# Patient Record
Sex: Female | Born: 1965 | Race: White | Hispanic: No | Marital: Single
Health system: Midwestern US, Academic
[De-identification: ages and names within clinical notes are randomized; demographics above are authoritative.]

---

## 2017-03-22 LAB — IRON, TOTAL SERUM: Lab: 72 — ABNORMAL HIGH (ref 27.0–31.0)

## 2017-03-22 LAB — LIPID PROFILE: Lab: 142 — ABNORMAL LOW (ref 150–200)

## 2017-03-22 LAB — COMPREHENSIVE METABOLIC PANEL: Lab: 143

## 2017-03-22 LAB — MAGNESIUM: Lab: 2.1

## 2017-03-22 LAB — THYROID STIMULATING HORMONE-TSH: Lab: 0.6 — ABNORMAL HIGH (ref 80.0–99.0)

## 2017-03-22 LAB — FERRITIN: Lab: 153

## 2017-03-22 LAB — PHOSPHORUS: Lab: 3.1

## 2017-05-07 LAB — COMPREHENSIVE METABOLIC PANEL
Lab: 104
Lab: 138
Lab: 3.3 — ABNORMAL LOW (ref 3.5–5.1)

## 2017-05-07 LAB — CBC: Lab: 7

## 2017-05-07 LAB — TROPONIN-I

## 2017-06-12 LAB — BASIC METABOLIC PANEL
Lab: 0.6
Lab: 12
Lab: 141
Lab: 23
Lab: 72

## 2017-06-12 LAB — COPPER FREE SERUM OR PLASMA: Lab: 99 — ABNORMAL HIGH (ref 98–107)

## 2017-06-12 LAB — 25-OH VITAMIN D (D2 + D3): Lab: 10 — ABNORMAL LOW (ref 30.0–100.0)

## 2017-07-23 ENCOUNTER — Encounter: Admit: 2017-07-23 | Discharge: 2017-07-23 | Payer: MEDICARE

## 2017-07-23 DIAGNOSIS — Z8249 Family history of ischemic heart disease and other diseases of the circulatory system: Principal | ICD-10-CM

## 2017-07-31 ENCOUNTER — Encounter: Admit: 2017-07-31 | Discharge: 2017-07-31 | Payer: MEDICARE

## 2017-08-16 ENCOUNTER — Encounter: Admit: 2017-08-16 | Discharge: 2017-08-16 | Payer: MEDICARE

## 2017-08-16 ENCOUNTER — Ambulatory Visit: Admit: 2017-08-16 | Discharge: 2017-08-17 | Payer: MEDICARE

## 2017-08-16 DIAGNOSIS — R001 Bradycardia, unspecified: ICD-10-CM

## 2017-08-16 DIAGNOSIS — R55 Syncope and collapse: Principal | ICD-10-CM

## 2017-08-16 NOTE — Progress Notes
Date of Service: 08/16/2017    Gabriela Hunt is a 51 y.o. female.       HPI     Gabriela Hunt presents today to establish cardiovascular care.  Mild valvular abnormalities were described on a prior echocardiogram.  After undergoing bariatric surgery in 2014, Gabriela Hunt had recurrent syncope for 2 years.  She reports that 2 medications were eliminated from her medical regimen and her syncope resolved and she has had no recurrence of syncope or presyncope over the past 2 years.  This suggests that her syncope may have been due to orthostasis and hypotension.  Currently, she notices only very minimal orthostasis when she stands and this resolves quickly.  Otherwise, the patient has been stable from a cardiovascular perspective and reports no angina, congestive symptoms, palpitations, sensation of sustained forceful heart pounding, lightheadedness or syncope. Exercise tolerance has been stable, although she does not have a regular exercise routine.. The patient reports no myalgias, bleeding abnormalities, neurologic motor abnormalities or difficulty with speech.     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  Today she is accompanied in clinic by a caregiver.  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       Vitals:    08/16/17 0910 08/16/17 0931   BP: 116/86 110/82   Pulse: 75    Weight: 83.2 kg (183 lb 6.4 oz)    Height: 1.727 m (5' 8)      Body mass index is 27.89 kg/m???.     Past Medical History  Patient Active Problem List    Diagnosis Date Noted   ??? Orthostatic hypotension 07/08/2015   ??? Syncope 07/08/2015     02/23/2015 Echo Normal LV size and systolic function EF 60-65 %.  Diastolic relaxation abnormality.  Mild LAE.  Mild aortic regurgitation. Mild mitral regurgitation.  Mild tricuspid regurgitation.     ??? Sinus bradycardia 07/08/2015 ??? Anxiety 07/08/2015   ??? Eczema 07/08/2015   ??? Depressive disorder 07/08/2015   ??? Asthma 07/08/2015   ??? SOB (shortness of breath) on exertion 07/08/2015   ??? Traumatic brain injury [facility]) 07/08/2015     2011: Larey Seat getting out of shower and hit her head-short term memory.     ??? COPD (chronic obstructive pulmonary disease) (HCC) 07/08/2015   ??? Falls 07/08/2015     03/26/15             Review of Systems   Constitution: Positive for malaise/fatigue.   HENT: Negative.    Eyes: Positive for visual halos.   Cardiovascular: Negative.    Respiratory: Negative.    Endocrine: Negative.    Hematologic/Lymphatic: Negative.    Skin: Positive for dry skin.   Musculoskeletal: Positive for arthritis, back pain, falls, joint pain and muscle cramps.   Gastrointestinal: Positive for nausea and vomiting.   Genitourinary: Negative.    Neurological: Negative.    Psychiatric/Behavioral: Positive for depression and memory loss. The patient is nervous/anxious.    Allergic/Immunologic: Negative.        Physical Exam  GENERAL: The patient is well developed, well nourished, resting comfortably and in no distress.   HEENT: No abnormalities of the visible oro-nasopharynx, conjunctiva or sclera are noted.  NECK: There is no jugular venous distension. Carotids are palpable and without bruits. There is no thyroid enlargement.  Chest: Lung fields are  clear to auscultation. There are no wheezes or crackles.  CV: There is a regular rhythm. The first and second heart sounds are normal. There are no murmurs, gallops or rubs.  ABD: The abdomen is soft and supple with normal bowel sounds. There is no hepatosplenomegaly, ascites, tenderness, masses or bruits.  Neuro: There are no focal motor defects. Ambulation is normal. Cognitive function appears normal.  Ext: There is no edema or evidence of deep vein thrombosis. Peripheral pulses are satisfactory.    SKIN: There are no rashes and no cellulitis  PSYCH: The patient is calm, rationale and oriented. Cardiovascular Studies  A twelve-lead ECG was obtained on 08/16/2017 reveals normal sinus rhythm with a heart rate of 75 bpm.  Left axis deviation is noted.  There is no evidence of myocardial ischemia or infarction.  Her QT interval is 400 ms yielding a QT corrected of 4 4 7  seconds.  Labs from 05/07/2017 revealed hemoglobin of 14.2 g/dL, hematocrit 16.1%, white count 7.0 and platelet count 171K.  Labs from 03/22/2017 revealed serum creatinine 0.64 mg/dL, ALT 39, TSH 0.96 (normal range 0.35-4.94 ???U/L), total cholesterol 142, triglycerides 94, HDL 43, and LDL 78 mg/dL her magnesium was 2.1 mg/dL  Her lipid profile and clinical profile give her a 0.9% risk of developing ASCVD in 10 years.  An outside echocardiogram obtained on 02/23/2015 revealed 1) normal left ventricular size and systolic function with a left ventricular ejection fraction of 60-65%.  2) diastolic relaxation abnormality.  3) mild left atrial enlargement.  4) mild aortic valve regurgitation.  5) mild mitral valve regurgitation, and 6) mild tricuspid valve regurgitation.    Problems Addressed Today  Orthostatic hypotension  Assessment and Plan     Gabriela Hunt used to have recurrent syncope which was probably due to orthostatic hypotension.  She currently has minimal orthostasis and reports no angina or congestive symptoms, palpitations, lightheadedness presyncope or syncope.  She has not had an episode of syncope in approximately 2 years she appears to be tolerating her current medical regimen.difficulty. Regular mild aerobic exercise and adherence to a heart healthy diet were recommended.  I have asked her to return for follow-up in approximately 1 years time.           Current Medications (including today's revisions)  ??? albuterol (PROAIR HFA, VENTOLIN HFA, OR PROVENTIL HFA) 90 mcg/actuation inhaler Inhale 1-2 puffs by mouth into the lungs every 4-6 hours as needed for Wheezing or Shortness of Breath. Shake well before use. ??? albuterol 0.5% (PROVENTIL; VENTOLIN) 2.5 mg/0.5 mL nebulizer solution Inhale 2.5 mg solution by nebulizer as directed every 4 hours as needed for Shortness of Breath or Wheezing.   ??? busPIRone (BUSPAR) 10 mg tablet Take 10 mg by mouth three times daily.   ??? cholecalciferol(+) (Vitamin D3) 50,000 units capsule Take 50,000 Units by mouth every 7 days.   ??? Copper Gluconate 2 mg tab Take 2 mg by mouth daily.   ??? cyanocobalamin (VITAMIN B-12, RUBRAMIN) 1,000 mcg/mL injection Inject 1 mL into the muscle every 30 days.   ??? divalproex ER (DEPAKOTE ER) 250 mg ER tablet Take 250 mg by mouth at bedtime daily. Take with food.   ??? fluconazole (DIFLUCAN) 150 mg tablet Take 150 mg by mouth daily.   ??? fludrocortisone (FLORINEF) 0.1 mg tablet Take 0.1 mg by mouth daily.   ??? fluticasone/salmeterol (ADVAIR DISKUS) 250/50 mcg inhalation disk Inhale 1 puff by mouth into the lungs as Needed.   ??? gabapentin (NEURONTIN) 600 mg tablet  Take 600 mg by mouth three times daily.   ??? HYDROcodone/acetaminophen(+) (NORCO) 10/325 mg tablet Take 1 tablet by mouth four times daily as needed for Pain    ??? loperamide (IMODIUM) 2 mg capsule Take 2 mg by mouth as Needed for Diarrhea.   ??? LORazepam (ATIVAN) 1 mg tablet Take 1 mg by mouth three times daily as needed for Nausea, Vomiting or Other....   ??? nystatin (MYCOSTATIN) 100,000 unit/g topical ointment Apply  topically to affected area as Needed.   ??? omeprazole DR(+) (PRILOSEC) 20 mg capsule Take 20 mg by mouth daily before breakfast.   ??? potassium chloride SR (K-DUR) 20 mEq tablet Take 20 mEq by mouth daily. Take with a meal and a full glass of water.    ??? promethazine (PHENERGAN) 25 mg tablet Take 25 mg by mouth as Needed.   ??? sertraline (ZOLOFT) 25 mg tablet Take 25 mg by mouth daily.   ??? SUMAtriptan succinate (IMITREX) 50 mg tablet Take 50 mg by mouth twice daily as needed for Migraine symptoms. Dose may be repeated in 2 hours if needed. Max of 2 tablets in 24 hours. ??? Syringe (Disposable) 5 mL syrg Use  as directed.   ??? traZODone (DESYREL) 100 mg tablet Take 100 mg by mouth at bedtime daily.   ??? zaleplon(+) (SONATA) 10 mg capsule Take 10 mg by mouth at bedtime daily.   ??? zolpidem CR(+) (AMBIEN CR) 12.5 mg tablet Take 12.5 mg by mouth at bedtime as needed for Sleep.

## 2017-12-14 ENCOUNTER — Encounter: Admit: 2017-12-14 | Discharge: 2017-12-14 | Payer: MEDICARE

## 2018-01-03 ENCOUNTER — Ambulatory Visit: Admit: 2018-01-03 | Discharge: 2018-01-04 | Payer: MEDICARE

## 2018-01-03 ENCOUNTER — Encounter: Admit: 2018-01-03 | Discharge: 2018-01-03 | Payer: MEDICARE

## 2018-01-03 DIAGNOSIS — F329 Major depressive disorder, single episode, unspecified: ICD-10-CM

## 2018-01-03 DIAGNOSIS — G43909 Migraine, unspecified, not intractable, without status migrainosus: ICD-10-CM

## 2018-01-03 DIAGNOSIS — S99922S Unspecified injury of left foot, sequela: ICD-10-CM

## 2018-01-03 DIAGNOSIS — S069X9A Unspecified intracranial injury with loss of consciousness of unspecified duration, initial encounter: ICD-10-CM

## 2018-01-03 DIAGNOSIS — G4700 Insomnia, unspecified: ICD-10-CM

## 2018-01-03 DIAGNOSIS — R1110 Vomiting, unspecified: ICD-10-CM

## 2018-01-03 DIAGNOSIS — G8929 Other chronic pain: ICD-10-CM

## 2018-01-03 DIAGNOSIS — Z9884 Bariatric surgery status: ICD-10-CM

## 2018-01-03 DIAGNOSIS — I951 Orthostatic hypotension: Principal | ICD-10-CM

## 2018-01-03 DIAGNOSIS — Z01810 Encounter for preprocedural cardiovascular examination: ICD-10-CM

## 2018-01-03 DIAGNOSIS — F419 Anxiety disorder, unspecified: ICD-10-CM

## 2018-01-03 DIAGNOSIS — M5136 Other intervertebral disc degeneration, lumbar region: ICD-10-CM

## 2018-01-03 DIAGNOSIS — J449 Chronic obstructive pulmonary disease, unspecified: ICD-10-CM

## 2018-01-09 LAB — COMPREHENSIVE METABOLIC PANEL
Lab: 0.4
Lab: 0.6
Lab: 106
Lab: 110
Lab: 12
Lab: 12 — ABNORMAL LOW (ref 33.0–37.0)
Lab: 139 — ABNORMAL LOW (ref 12.0–16.0)
Lab: 25
Lab: 28
Lab: 3.5
Lab: 34
Lab: 4.2 — ABNORMAL LOW (ref 37.0–47.0)
Lab: 6.9
Lab: 71 — ABNORMAL LOW (ref 8.4–10.2)
Lab: 9.2
Lab: 95

## 2018-01-15 ENCOUNTER — Encounter: Admit: 2018-01-15 | Discharge: 2018-01-15 | Payer: MEDICARE

## 2018-01-15 DIAGNOSIS — M25561 Pain in right knee: Principal | ICD-10-CM

## 2018-06-09 LAB — CBC: Lab: 7.4

## 2018-06-09 LAB — BASIC METABOLIC PANEL: Lab: 136 — ABNORMAL LOW (ref 4.20–5.40)

## 2018-07-11 ENCOUNTER — Ambulatory Visit: Admit: 2018-07-11 | Discharge: 2018-07-12 | Payer: MEDICARE

## 2018-07-11 ENCOUNTER — Encounter: Admit: 2018-07-11 | Discharge: 2018-07-11 | Payer: MEDICARE

## 2018-07-11 DIAGNOSIS — R001 Bradycardia, unspecified: ICD-10-CM

## 2018-07-11 DIAGNOSIS — S069X9A Unspecified intracranial injury with loss of consciousness of unspecified duration, initial encounter: ICD-10-CM

## 2018-07-11 DIAGNOSIS — F329 Major depressive disorder, single episode, unspecified: ICD-10-CM

## 2018-07-11 DIAGNOSIS — I951 Orthostatic hypotension: ICD-10-CM

## 2018-07-11 DIAGNOSIS — S99922S Unspecified injury of left foot, sequela: ICD-10-CM

## 2018-07-11 DIAGNOSIS — E271 Primary adrenocortical insufficiency: ICD-10-CM

## 2018-07-11 DIAGNOSIS — Z9884 Bariatric surgery status: ICD-10-CM

## 2018-07-11 DIAGNOSIS — F419 Anxiety disorder, unspecified: ICD-10-CM

## 2018-07-11 DIAGNOSIS — J45909 Unspecified asthma, uncomplicated: ICD-10-CM

## 2018-07-11 DIAGNOSIS — M5136 Other intervertebral disc degeneration, lumbar region: ICD-10-CM

## 2018-07-11 DIAGNOSIS — J449 Chronic obstructive pulmonary disease, unspecified: ICD-10-CM

## 2018-07-11 DIAGNOSIS — G4700 Insomnia, unspecified: ICD-10-CM

## 2018-07-11 DIAGNOSIS — R0602 Shortness of breath: ICD-10-CM

## 2018-07-11 DIAGNOSIS — R55 Syncope and collapse: ICD-10-CM

## 2018-07-11 DIAGNOSIS — R079 Chest pain, unspecified: ICD-10-CM

## 2018-07-11 DIAGNOSIS — R1110 Vomiting, unspecified: ICD-10-CM

## 2018-07-11 DIAGNOSIS — R072 Precordial pain: ICD-10-CM

## 2018-07-11 DIAGNOSIS — G43909 Migraine, unspecified, not intractable, without status migrainosus: ICD-10-CM

## 2018-07-11 DIAGNOSIS — W19XXXA Unspecified fall, initial encounter: ICD-10-CM

## 2018-07-11 DIAGNOSIS — R600 Localized edema: ICD-10-CM

## 2018-07-11 DIAGNOSIS — G8929 Other chronic pain: ICD-10-CM

## 2018-07-12 ENCOUNTER — Encounter: Admit: 2018-07-12 | Discharge: 2018-07-12 | Payer: MEDICARE

## 2018-07-15 ENCOUNTER — Encounter: Admit: 2018-07-15 | Discharge: 2018-07-15 | Payer: MEDICARE

## 2018-07-22 ENCOUNTER — Encounter: Admit: 2018-07-22 | Discharge: 2018-07-22 | Payer: MEDICARE

## 2018-07-26 ENCOUNTER — Encounter: Admit: 2018-07-26 | Discharge: 2018-07-26 | Payer: MEDICARE

## 2018-08-08 ENCOUNTER — Encounter: Admit: 2018-08-08 | Discharge: 2018-08-08 | Payer: MEDICARE

## 2018-08-08 ENCOUNTER — Ambulatory Visit: Admit: 2018-08-08 | Discharge: 2018-08-08 | Payer: MEDICARE

## 2018-08-08 DIAGNOSIS — R0602 Shortness of breath: Principal | ICD-10-CM

## 2018-08-08 DIAGNOSIS — R079 Chest pain, unspecified: ICD-10-CM

## 2018-08-19 ENCOUNTER — Encounter: Admit: 2018-08-19 | Discharge: 2018-08-19 | Payer: MEDICARE

## 2018-08-19 DIAGNOSIS — R55 Syncope and collapse: ICD-10-CM

## 2018-08-19 DIAGNOSIS — R9430 Abnormal result of cardiovascular function study, unspecified: Principal | ICD-10-CM

## 2018-08-19 DIAGNOSIS — R0602 Shortness of breath: ICD-10-CM

## 2018-08-19 DIAGNOSIS — Z9884 Bariatric surgery status: ICD-10-CM

## 2018-09-06 ENCOUNTER — Ambulatory Visit: Admit: 2018-09-06 | Discharge: 2018-09-06 | Payer: MEDICARE

## 2018-09-06 DIAGNOSIS — R0602 Shortness of breath: ICD-10-CM

## 2018-09-06 DIAGNOSIS — R9430 Abnormal result of cardiovascular function study, unspecified: Principal | ICD-10-CM

## 2018-09-06 DIAGNOSIS — Z9884 Bariatric surgery status: ICD-10-CM

## 2018-09-06 DIAGNOSIS — R55 Syncope and collapse: ICD-10-CM

## 2018-09-06 MED ORDER — SODIUM CHLORIDE 0.9 % IJ SOLN
50 mL | Freq: Once | INTRAVENOUS | 0 refills | Status: CP
Start: 2018-09-06 — End: ?
  Administered 2018-09-06: 16:00:00 50 mL via INTRAVENOUS

## 2018-09-06 MED ORDER — GADOBENATE DIMEGLUMINE 529 MG/ML (0.1MMOL/0.2ML) IV SOLN
40 mL | Freq: Once | INTRAVENOUS | 0 refills | Status: CP
Start: 2018-09-06 — End: ?
  Administered 2018-09-06: 16:00:00 40 mL via INTRAVENOUS

## 2018-09-10 ENCOUNTER — Encounter: Admit: 2018-09-10 | Discharge: 2018-09-10 | Payer: MEDICARE

## 2018-10-01 ENCOUNTER — Ambulatory Visit: Admit: 2018-10-01 | Discharge: 2018-10-01 | Payer: MEDICARE

## 2018-10-01 ENCOUNTER — Encounter: Admit: 2018-10-01 | Discharge: 2018-10-01 | Payer: MEDICARE

## 2018-10-01 DIAGNOSIS — F329 Major depressive disorder, single episode, unspecified: ICD-10-CM

## 2018-10-01 DIAGNOSIS — S069X9A Unspecified intracranial injury with loss of consciousness of unspecified duration, initial encounter: ICD-10-CM

## 2018-10-01 DIAGNOSIS — Z9884 Bariatric surgery status: ICD-10-CM

## 2018-10-01 DIAGNOSIS — G4700 Insomnia, unspecified: ICD-10-CM

## 2018-10-01 DIAGNOSIS — J449 Chronic obstructive pulmonary disease, unspecified: ICD-10-CM

## 2018-10-01 DIAGNOSIS — R001 Bradycardia, unspecified: ICD-10-CM

## 2018-10-01 DIAGNOSIS — S99922S Unspecified injury of left foot, sequela: ICD-10-CM

## 2018-10-01 DIAGNOSIS — R931 Abnormal findings on diagnostic imaging of heart and coronary circulation: ICD-10-CM

## 2018-10-01 DIAGNOSIS — G8929 Other chronic pain: ICD-10-CM

## 2018-10-01 DIAGNOSIS — R1110 Vomiting, unspecified: ICD-10-CM

## 2018-10-01 DIAGNOSIS — M5136 Other intervertebral disc degeneration, lumbar region: ICD-10-CM

## 2018-10-01 DIAGNOSIS — F419 Anxiety disorder, unspecified: ICD-10-CM

## 2018-10-01 DIAGNOSIS — W19XXXA Unspecified fall, initial encounter: ICD-10-CM

## 2018-10-01 DIAGNOSIS — E271 Primary adrenocortical insufficiency: ICD-10-CM

## 2018-10-01 DIAGNOSIS — I951 Orthostatic hypotension: Principal | ICD-10-CM

## 2018-10-01 DIAGNOSIS — G43909 Migraine, unspecified, not intractable, without status migrainosus: ICD-10-CM

## 2018-10-01 IMAGING — CR LOW_EXM
2 series · 2 of 2 positions shown · non-contrast
Comparison: none

[knee ap]
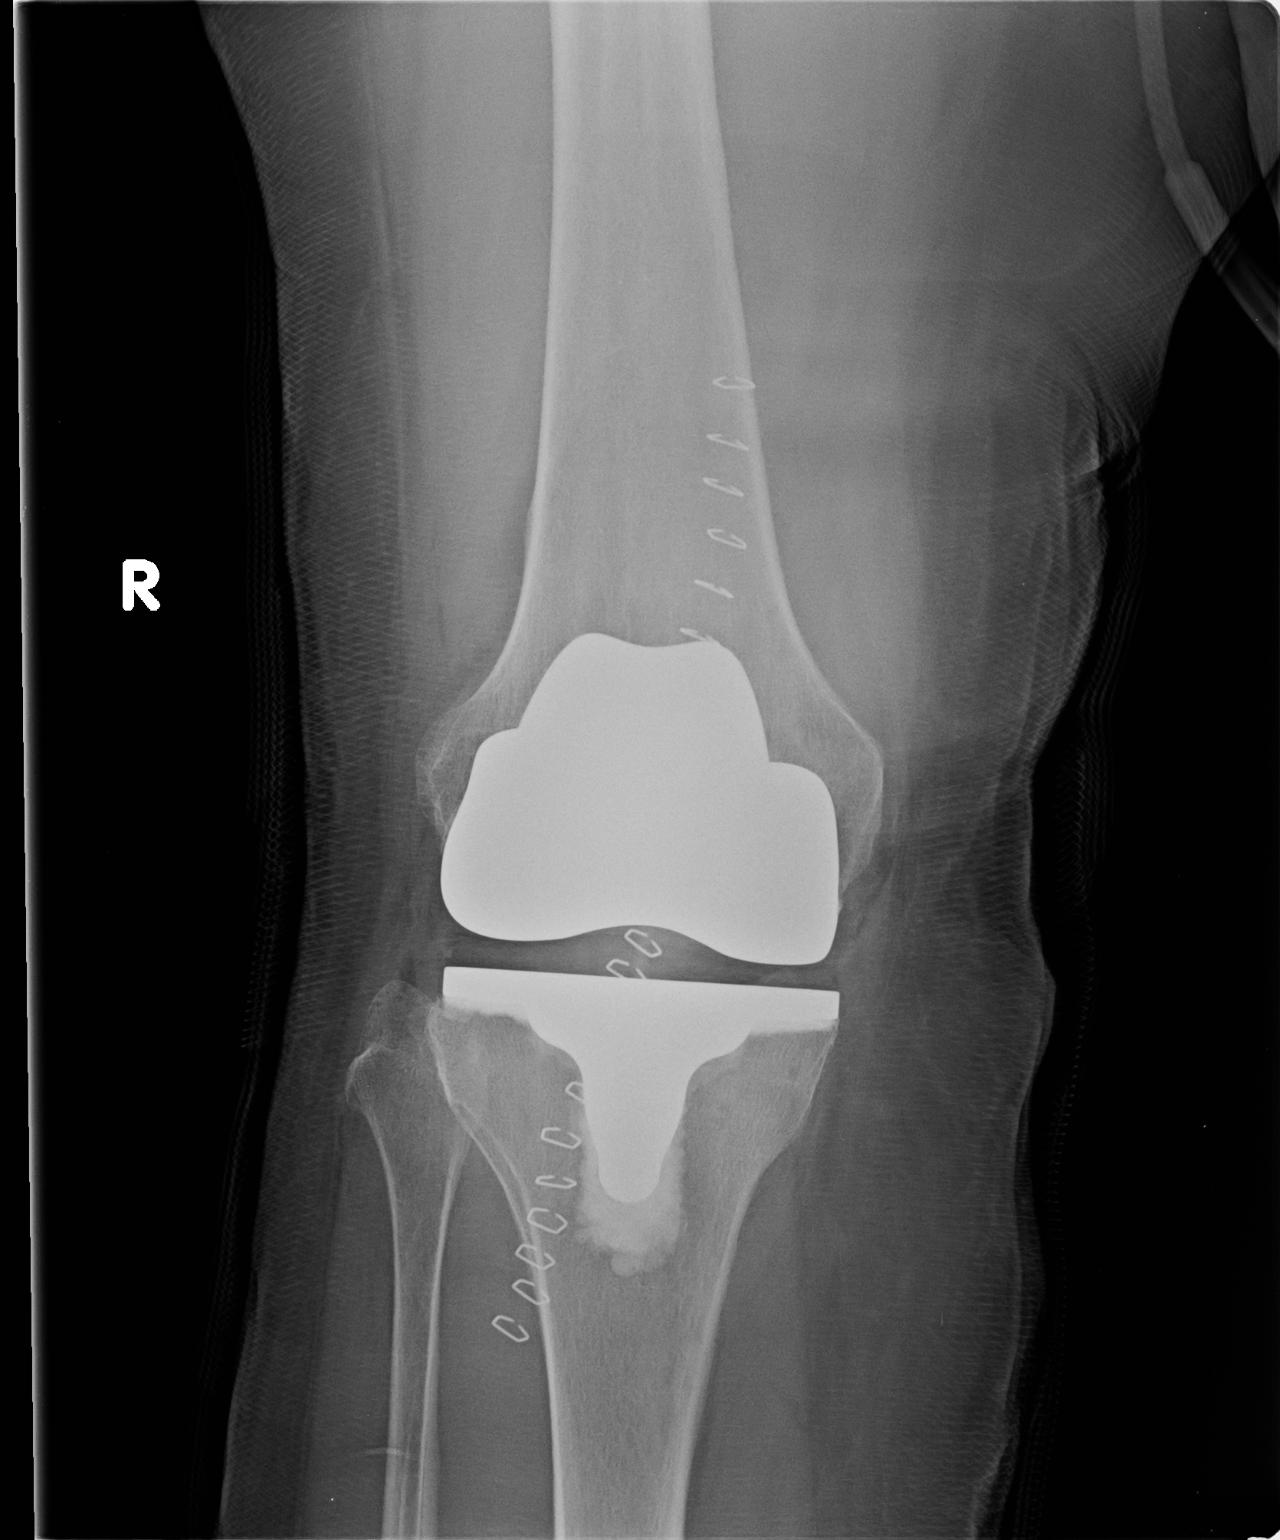

[knee lat]
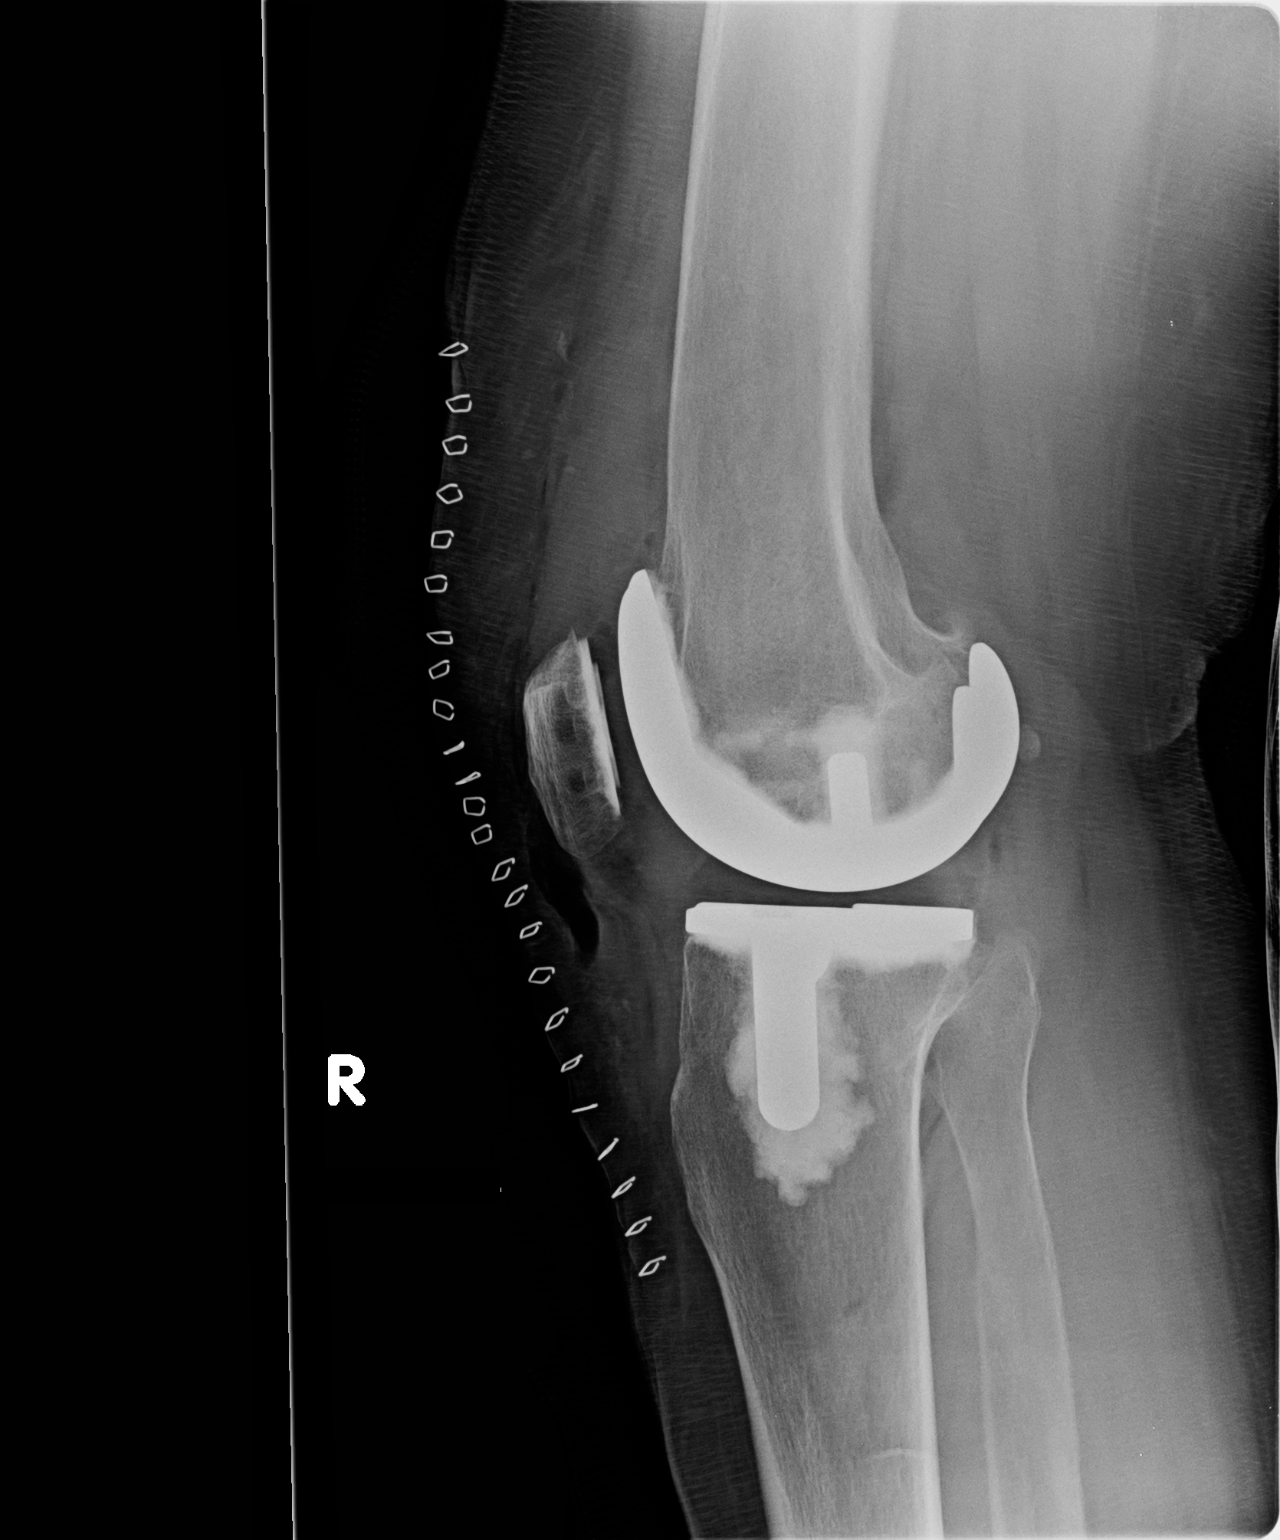

[2 of 2 positions shown; findings below may reference images not displayed]

DIAGNOSTIC STUDIES

EXAM

RADIOLOGICAL EXAMINATION, KNEE RIGHT; 1 OR 2 VIEWS CPT 01917

INDICATION

Postop total knee arthroplasty.

COMPARISONS

No priors available for comparison.

FINDINGS

Status post total knee arthroplasty. The hardware appears intact. There are staples overlying the
knee.

IMPRESSION

Status post total knee arthroplasty.

Tech Notes:

POST OP RIGHT TKA. TJ/CK

## 2018-10-02 ENCOUNTER — Encounter: Admit: 2018-10-02 | Discharge: 2018-10-02 | Payer: MEDICARE

## 2018-10-02 ENCOUNTER — Ambulatory Visit: Admit: 2018-10-02 | Discharge: 2018-10-03 | Payer: MEDICARE

## 2018-10-02 DIAGNOSIS — R1110 Vomiting, unspecified: ICD-10-CM

## 2018-10-02 DIAGNOSIS — F329 Major depressive disorder, single episode, unspecified: ICD-10-CM

## 2018-10-02 DIAGNOSIS — I951 Orthostatic hypotension: Principal | ICD-10-CM

## 2018-10-02 DIAGNOSIS — G43909 Migraine, unspecified, not intractable, without status migrainosus: ICD-10-CM

## 2018-10-02 DIAGNOSIS — S99922S Unspecified injury of left foot, sequela: ICD-10-CM

## 2018-10-02 DIAGNOSIS — Z9884 Bariatric surgery status: ICD-10-CM

## 2018-10-02 DIAGNOSIS — M5136 Other intervertebral disc degeneration, lumbar region: ICD-10-CM

## 2018-10-02 DIAGNOSIS — J449 Chronic obstructive pulmonary disease, unspecified: ICD-10-CM

## 2018-10-02 DIAGNOSIS — G8929 Other chronic pain: ICD-10-CM

## 2018-10-02 DIAGNOSIS — S069X9A Unspecified intracranial injury with loss of consciousness of unspecified duration, initial encounter: ICD-10-CM

## 2018-10-02 DIAGNOSIS — F419 Anxiety disorder, unspecified: ICD-10-CM

## 2018-10-02 DIAGNOSIS — G4700 Insomnia, unspecified: ICD-10-CM

## 2018-10-03 ENCOUNTER — Encounter: Admit: 2018-11-15 | Discharge: 2018-11-16 | Payer: MEDICARE

## 2018-10-03 ENCOUNTER — Encounter: Admit: 2018-10-03 | Discharge: 2018-10-03 | Payer: MEDICARE

## 2018-10-03 DIAGNOSIS — E65 Localized adiposity: Principal | ICD-10-CM

## 2018-10-03 DIAGNOSIS — M793 Panniculitis, unspecified: Principal | ICD-10-CM

## 2018-10-08 ENCOUNTER — Encounter: Admit: 2018-10-08 | Discharge: 2018-10-08 | Payer: MEDICARE

## 2018-10-08 DIAGNOSIS — J449 Chronic obstructive pulmonary disease, unspecified: Principal | ICD-10-CM

## 2018-11-13 ENCOUNTER — Encounter: Admit: 2018-11-13 | Discharge: 2018-11-13 | Payer: MEDICARE

## 2018-11-13 ENCOUNTER — Ambulatory Visit: Admit: 2018-11-13 | Discharge: 2018-11-14 | Payer: MEDICARE

## 2018-11-13 DIAGNOSIS — G4700 Insomnia, unspecified: Secondary | ICD-10-CM

## 2018-11-13 DIAGNOSIS — S069X9A Unspecified intracranial injury with loss of consciousness of unspecified duration, initial encounter: Secondary | ICD-10-CM

## 2018-11-13 DIAGNOSIS — F419 Anxiety disorder, unspecified: Secondary | ICD-10-CM

## 2018-11-13 DIAGNOSIS — M1990 Unspecified osteoarthritis, unspecified site: Secondary | ICD-10-CM

## 2018-11-13 DIAGNOSIS — S99922S Unspecified injury of left foot, sequela: Secondary | ICD-10-CM

## 2018-11-13 DIAGNOSIS — E610 Copper deficiency: Secondary | ICD-10-CM

## 2018-11-13 DIAGNOSIS — J42 Unspecified chronic bronchitis: Secondary | ICD-10-CM

## 2018-11-13 DIAGNOSIS — G43909 Migraine, unspecified, not intractable, without status migrainosus: Secondary | ICD-10-CM

## 2018-11-13 DIAGNOSIS — S329XXA Fracture of unspecified parts of lumbosacral spine and pelvis, initial encounter for closed fracture: Secondary | ICD-10-CM

## 2018-11-13 DIAGNOSIS — G629 Polyneuropathy, unspecified: Secondary | ICD-10-CM

## 2018-11-13 DIAGNOSIS — E271 Primary adrenocortical insufficiency: Secondary | ICD-10-CM

## 2018-11-13 DIAGNOSIS — R1110 Vomiting, unspecified: Secondary | ICD-10-CM

## 2018-11-13 DIAGNOSIS — I951 Orthostatic hypotension: Secondary | ICD-10-CM

## 2018-11-13 DIAGNOSIS — M5136 Other intervertebral disc degeneration, lumbar region: Secondary | ICD-10-CM

## 2018-11-13 DIAGNOSIS — K219 Gastro-esophageal reflux disease without esophagitis: Secondary | ICD-10-CM

## 2018-11-13 DIAGNOSIS — G8929 Other chronic pain: Secondary | ICD-10-CM

## 2018-11-13 DIAGNOSIS — J449 Chronic obstructive pulmonary disease, unspecified: Secondary | ICD-10-CM

## 2018-11-13 DIAGNOSIS — Z9884 Bariatric surgery status: Secondary | ICD-10-CM

## 2018-11-13 DIAGNOSIS — J45909 Unspecified asthma, uncomplicated: Secondary | ICD-10-CM

## 2018-11-13 DIAGNOSIS — N200 Calculus of kidney: Secondary | ICD-10-CM

## 2018-11-13 DIAGNOSIS — F329 Major depressive disorder, single episode, unspecified: Secondary | ICD-10-CM

## 2018-11-13 LAB — CBC
Lab: 13 g/dL (ref 12.0–15.0)
Lab: 17 % — ABNORMAL HIGH (ref 11–15)
Lab: 4.3 M/UL (ref 4.0–5.0)
Lab: 8.9 10*3/uL (ref 4.5–11.0)

## 2018-11-13 LAB — COMPREHENSIVE METABOLIC PANEL
Lab: 0.3 mg/dL (ref 0.3–1.2)
Lab: 0.7 mg/dL (ref 0.4–1.00)
Lab: 102 MMOL/L (ref 98–110)
Lab: 119 U/L — ABNORMAL HIGH (ref 25–110)
Lab: 134 MMOL/L — ABNORMAL LOW (ref 137–147)
Lab: 24 MMOL/L (ref 21–30)
Lab: 3.9 g/dL (ref 3.5–5.0)
Lab: 33 U/L (ref 7–40)
Lab: 4 MMOL/L (ref 3.5–5.1)
Lab: 45 U/L (ref 7–56)
Lab: 6.8 g/dL (ref 6.0–8.0)
Lab: 77 mg/dL (ref 70–100)
Lab: 8.6 mg/dL (ref 8.5–10.6)
Lab: >60 mL/min (ref >60)
Lab: >60 mL/min (ref >60)
Lab: 8 (ref 3–12)

## 2018-11-14 ENCOUNTER — Encounter: Admit: 2018-11-14 | Discharge: 2018-11-14 | Payer: MEDICARE

## 2018-11-14 DIAGNOSIS — E65 Localized adiposity: Secondary | ICD-10-CM

## 2018-11-14 DIAGNOSIS — J42 Unspecified chronic bronchitis: Secondary | ICD-10-CM

## 2018-11-14 DIAGNOSIS — N200 Calculus of kidney: Secondary | ICD-10-CM

## 2018-11-14 DIAGNOSIS — E271 Primary adrenocortical insufficiency: Secondary | ICD-10-CM

## 2018-11-14 DIAGNOSIS — F419 Anxiety disorder, unspecified: Secondary | ICD-10-CM

## 2018-11-14 DIAGNOSIS — R1110 Vomiting, unspecified: Secondary | ICD-10-CM

## 2018-11-14 DIAGNOSIS — E610 Copper deficiency: Secondary | ICD-10-CM

## 2018-11-14 DIAGNOSIS — F329 Major depressive disorder, single episode, unspecified: Secondary | ICD-10-CM

## 2018-11-14 DIAGNOSIS — M1990 Unspecified osteoarthritis, unspecified site: Secondary | ICD-10-CM

## 2018-11-14 DIAGNOSIS — G4700 Insomnia, unspecified: Secondary | ICD-10-CM

## 2018-11-14 DIAGNOSIS — S329XXA Fracture of unspecified parts of lumbosacral spine and pelvis, initial encounter for closed fracture: Secondary | ICD-10-CM

## 2018-11-14 DIAGNOSIS — G8929 Other chronic pain: Secondary | ICD-10-CM

## 2018-11-14 DIAGNOSIS — K219 Gastro-esophageal reflux disease without esophagitis: Secondary | ICD-10-CM

## 2018-11-14 DIAGNOSIS — S99922S Unspecified injury of left foot, sequela: Secondary | ICD-10-CM

## 2018-11-14 DIAGNOSIS — J449 Chronic obstructive pulmonary disease, unspecified: Secondary | ICD-10-CM

## 2018-11-14 DIAGNOSIS — I951 Orthostatic hypotension: Secondary | ICD-10-CM

## 2018-11-14 DIAGNOSIS — Z9884 Bariatric surgery status: Secondary | ICD-10-CM

## 2018-11-14 DIAGNOSIS — G629 Polyneuropathy, unspecified: Secondary | ICD-10-CM

## 2018-11-14 DIAGNOSIS — S069X9A Unspecified intracranial injury with loss of consciousness of unspecified duration, initial encounter: Secondary | ICD-10-CM

## 2018-11-14 DIAGNOSIS — M5136 Other intervertebral disc degeneration, lumbar region: Secondary | ICD-10-CM

## 2018-11-14 DIAGNOSIS — J45909 Unspecified asthma, uncomplicated: Secondary | ICD-10-CM

## 2018-11-14 DIAGNOSIS — Z01810 Encounter for preprocedural cardiovascular examination: Secondary | ICD-10-CM

## 2018-11-14 DIAGNOSIS — G43909 Migraine, unspecified, not intractable, without status migrainosus: Secondary | ICD-10-CM

## 2018-11-15 ENCOUNTER — Encounter: Admit: 2018-11-15 | Discharge: 2018-11-15 | Payer: MEDICARE

## 2018-11-15 DIAGNOSIS — G43909 Migraine, unspecified, not intractable, without status migrainosus: Secondary | ICD-10-CM

## 2018-11-15 DIAGNOSIS — M5136 Other intervertebral disc degeneration, lumbar region: Secondary | ICD-10-CM

## 2018-11-15 DIAGNOSIS — I951 Orthostatic hypotension: Secondary | ICD-10-CM

## 2018-11-15 DIAGNOSIS — G629 Polyneuropathy, unspecified: Secondary | ICD-10-CM

## 2018-11-15 DIAGNOSIS — G4700 Insomnia, unspecified: Secondary | ICD-10-CM

## 2018-11-15 DIAGNOSIS — K219 Gastro-esophageal reflux disease without esophagitis: Secondary | ICD-10-CM

## 2018-11-15 DIAGNOSIS — S329XXA Fracture of unspecified parts of lumbosacral spine and pelvis, initial encounter for closed fracture: Secondary | ICD-10-CM

## 2018-11-15 DIAGNOSIS — R1110 Vomiting, unspecified: Secondary | ICD-10-CM

## 2018-11-15 DIAGNOSIS — J42 Unspecified chronic bronchitis: Secondary | ICD-10-CM

## 2018-11-15 DIAGNOSIS — J449 Chronic obstructive pulmonary disease, unspecified: Secondary | ICD-10-CM

## 2018-11-15 DIAGNOSIS — J45909 Unspecified asthma, uncomplicated: Secondary | ICD-10-CM

## 2018-11-15 DIAGNOSIS — F419 Anxiety disorder, unspecified: Secondary | ICD-10-CM

## 2018-11-15 DIAGNOSIS — Z9884 Bariatric surgery status: Secondary | ICD-10-CM

## 2018-11-15 DIAGNOSIS — N200 Calculus of kidney: Secondary | ICD-10-CM

## 2018-11-15 DIAGNOSIS — E610 Copper deficiency: Secondary | ICD-10-CM

## 2018-11-15 DIAGNOSIS — S069X9A Unspecified intracranial injury with loss of consciousness of unspecified duration, initial encounter: Secondary | ICD-10-CM

## 2018-11-15 DIAGNOSIS — S99922S Unspecified injury of left foot, sequela: Secondary | ICD-10-CM

## 2018-11-15 DIAGNOSIS — G8929 Other chronic pain: Secondary | ICD-10-CM

## 2018-11-15 DIAGNOSIS — F329 Major depressive disorder, single episode, unspecified: Secondary | ICD-10-CM

## 2018-11-15 DIAGNOSIS — E271 Primary adrenocortical insufficiency: Secondary | ICD-10-CM

## 2018-11-15 DIAGNOSIS — M1990 Unspecified osteoarthritis, unspecified site: Secondary | ICD-10-CM

## 2018-11-15 MED ORDER — HYDROMORPHONE (PF) 2 MG/ML IJ SYRG
.5-1 mg | INTRAVENOUS | 0 refills | Status: DC | PRN
Start: 2018-11-15 — End: 2018-11-16
  Administered 2018-11-15 (×3): 0.5 mg via INTRAVENOUS

## 2018-11-15 MED ORDER — SUGAMMADEX 100 MG/ML IV SOLN
INTRAVENOUS | 0 refills | Status: DC
Start: 2018-11-15 — End: 2018-11-15
  Administered 2018-11-15: 18:00:00 200 mg via INTRAVENOUS

## 2018-11-15 MED ORDER — MEPERIDINE (PF) 25 MG/ML IJ SYRG
12.5 mg | INTRAVENOUS | 0 refills | Status: DC | PRN
Start: 2018-11-15 — End: 2018-11-16

## 2018-11-15 MED ORDER — LIDOCAINE (PF) 10 MG/ML (1 %) IJ SOLN
.1-2 mL | INTRAMUSCULAR | 0 refills | Status: DC | PRN
Start: 2018-11-15 — End: 2018-11-16

## 2018-11-15 MED ORDER — ALBUTEROL SULFATE 2.5 MG /3 ML (0.083 %) IN NEBU
2.5 mg | RESPIRATORY_TRACT | 0 refills | Status: DC | PRN
Start: 2018-11-15 — End: 2018-11-16
  Administered 2018-11-16: 12:00:00 2.5 mg via RESPIRATORY_TRACT

## 2018-11-15 MED ORDER — LIDOCAINE (PF) 200 MG/10 ML (2 %) IJ SYRG
0 refills | Status: DC
Start: 2018-11-15 — End: 2018-11-15
  Administered 2018-11-15: 17:00:00 100 mg via INTRAVENOUS

## 2018-11-15 MED ORDER — DIPHENHYDRAMINE HCL 50 MG/ML IJ SOLN
25 mg | Freq: Once | INTRAVENOUS | 0 refills | Status: DC | PRN
Start: 2018-11-15 — End: 2018-11-16

## 2018-11-15 MED ORDER — TOPIRAMATE 25 MG PO TAB
25 mg | Freq: Two times a day (BID) | ORAL | 0 refills | Status: DC
Start: 2018-11-15 — End: 2018-11-16
  Administered 2018-11-16 (×2): 25 mg via ORAL

## 2018-11-15 MED ORDER — FLUDROCORTISONE 0.1 MG PO TAB
0.1 mg | Freq: Every day | ORAL | 0 refills | Status: DC
Start: 2018-11-15 — End: 2018-11-16
  Administered 2018-11-16 (×2): 0.1 mg via ORAL

## 2018-11-15 MED ORDER — ALBUTEROL SULFATE 2.5 MG/0.5 ML IN NEBU
2.5 mg | RESPIRATORY_TRACT | 0 refills | Status: DC | PRN
Start: 2018-11-15 — End: 2018-11-16

## 2018-11-15 MED ORDER — LACTATED RINGERS IV SOLP
INTRAVENOUS | 0 refills | Status: DC
Start: 2018-11-15 — End: 2018-11-16
  Administered 2018-11-15 (×2): 1000.000 mL via INTRAVENOUS

## 2018-11-15 MED ORDER — ACETAMINOPHEN 500 MG PO TAB
1000 mg | Freq: Once | ORAL | 0 refills | Status: CP
Start: 2018-11-15 — End: ?
  Administered 2018-11-15: 16:00:00 1000 mg via ORAL

## 2018-11-15 MED ORDER — BACLOFEN 10 MG PO TAB
10 mg | Freq: Four times a day (QID) | ORAL | 0 refills | Status: DC | PRN
Start: 2018-11-15 — End: 2018-11-16
  Administered 2018-11-16: 08:00:00 10 mg via ORAL

## 2018-11-15 MED ORDER — ZOLPIDEM 5 MG PO TAB
12.5 mg | Freq: Every evening | ORAL | 0 refills | Status: DC | PRN
Start: 2018-11-15 — End: 2018-11-16
  Administered 2018-11-16: 04:00:00 12.5 mg via ORAL

## 2018-11-15 MED ORDER — IMS MIXTURE TEMPLATE
50 mg | ORAL | 0 refills | Status: DC
Start: 2018-11-15 — End: 2018-11-16

## 2018-11-15 MED ORDER — HYDROCORTISONE 10 MG PO TAB
10 mg | Freq: Every day | ORAL | 0 refills | Status: DC
Start: 2018-11-15 — End: 2018-11-16

## 2018-11-15 MED ORDER — CETIRIZINE 10 MG PO TAB
10 mg | Freq: Every day | ORAL | 0 refills | Status: DC
Start: 2018-11-15 — End: 2018-11-16
  Administered 2018-11-16 (×2): 10 mg via ORAL

## 2018-11-15 MED ORDER — PROMETHAZINE 25 MG/ML IJ SOLN
6.25 mg | INTRAVENOUS | 0 refills | Status: DC | PRN
Start: 2018-11-15 — End: 2018-11-16

## 2018-11-15 MED ORDER — DEXTRAN 70-HYPROMELLOSE (PF) 0.1-0.3 % OP DPET
0 refills | Status: DC
Start: 2018-11-15 — End: 2018-11-15
  Administered 2018-11-15: 17:00:00 2 [drp] via OPHTHALMIC

## 2018-11-15 MED ORDER — ALBUTEROL SULFATE 90 MCG/ACTUATION IN HFAA
2 | RESPIRATORY_TRACT | 0 refills | Status: DC | PRN
Start: 2018-11-15 — End: 2018-11-16

## 2018-11-15 MED ORDER — IMS MIXTURE TEMPLATE
50 mg | ORAL | 0 refills | Status: DC
Start: 2018-11-15 — End: 2018-11-16
  Administered 2018-11-16 (×4): 50 mg via ORAL

## 2018-11-15 MED ORDER — OXYCODONE 5 MG PO TAB
5-10 mg | ORAL | 0 refills | Status: DC | PRN
Start: 2018-11-15 — End: 2018-11-16
  Administered 2018-11-16: 17:00:00 5 mg via ORAL
  Administered 2018-11-16 (×3): 10 mg via ORAL
  Administered 2018-11-16: 17:00:00 5 mg via ORAL

## 2018-11-15 MED ORDER — FENTANYL CITRATE (PF) 50 MCG/ML IJ SOLN
25-50 ug | Freq: Once | INTRAVENOUS | 0 refills | Status: CP
Start: 2018-11-15 — End: ?
  Administered 2018-11-16: 02:00:00 25 ug via INTRAVENOUS

## 2018-11-15 MED ORDER — EPHEDRINE SULFATE 50 MG/5ML SYR (10 MG/ML) (AN)(OSM)
0 refills | Status: DC
Start: 2018-11-15 — End: 2018-11-15
  Administered 2018-11-15 (×2): 10 mg via INTRAVENOUS

## 2018-11-15 MED ORDER — LACTATED RINGERS IV SOLP
INTRAVENOUS | 0 refills | Status: DC
Start: 2018-11-15 — End: 2018-11-16
  Administered 2018-11-16 (×2): 1000.000 mL via INTRAVENOUS

## 2018-11-15 MED ORDER — MIDAZOLAM 1 MG/ML IJ SOLN
INTRAVENOUS | 0 refills | Status: DC
Start: 2018-11-15 — End: 2018-11-15
  Administered 2018-11-15: 17:00:00 2 mg via INTRAVENOUS

## 2018-11-15 MED ORDER — FENTANYL CITRATE (PF) 50 MCG/ML IJ SOLN
0 refills | Status: DC
Start: 2018-11-15 — End: 2018-11-15
  Administered 2018-11-15: 17:00:00 50 ug via INTRAVENOUS
  Administered 2018-11-15 (×2): 25 ug via INTRAVENOUS
  Administered 2018-11-15: 17:00:00 100 ug via INTRAVENOUS

## 2018-11-15 MED ORDER — PROPOFOL INJ 10 MG/ML IV VIAL
0 refills | Status: DC
Start: 2018-11-15 — End: 2018-11-15
  Administered 2018-11-15: 17:00:00 130 mg via INTRAVENOUS

## 2018-11-15 MED ORDER — IMS MIXTURE TEMPLATE
50 mg | Freq: Once | ORAL | 0 refills | Status: CP
Start: 2018-11-15 — End: ?
  Administered 2018-11-16 (×2): 50 mg via ORAL

## 2018-11-15 MED ORDER — HYDROCORTISONE 20 MG PO TAB
20 mg | Freq: Every day | ORAL | 0 refills | Status: DC
Start: 2018-11-15 — End: 2018-11-16

## 2018-11-15 MED ORDER — FENTANYL CITRATE (PF) 50 MCG/ML IJ SOLN
50 ug | INTRAVENOUS | 0 refills | Status: DC | PRN
Start: 2018-11-15 — End: 2018-11-16
  Administered 2018-11-15: 19:00:00 50 ug via INTRAVENOUS

## 2018-11-15 MED ORDER — ACETAMINOPHEN 325 MG PO TAB
650 mg | ORAL | 0 refills | Status: DC | PRN
Start: 2018-11-15 — End: 2018-11-16
  Administered 2018-11-16: 08:00:00 650 mg via ORAL

## 2018-11-15 MED ORDER — BUDESONIDE-FORMOTEROL 160-4.5 MCG/ACTUATION IN HFAA
2 | Freq: Two times a day (BID) | RESPIRATORY_TRACT | 0 refills | Status: DC
Start: 2018-11-15 — End: 2018-11-16
  Administered 2018-11-16: 03:00:00 2 via RESPIRATORY_TRACT

## 2018-11-15 MED ORDER — CEFAZOLIN 1 GRAM IJ SOLR
0 refills | Status: DC
Start: 2018-11-15 — End: 2018-11-15
  Administered 2018-11-15: 17:00:00 2 g via INTRAVENOUS

## 2018-11-15 MED ORDER — ONDANSETRON HCL (PF) 4 MG/2 ML IJ SOLN
INTRAVENOUS | 0 refills | Status: DC
Start: 2018-11-15 — End: 2018-11-15

## 2018-11-15 MED ORDER — ROCURONIUM 10 MG/ML IV SOLN
INTRAVENOUS | 0 refills | Status: DC
Start: 2018-11-15 — End: 2018-11-15
  Administered 2018-11-15: 17:00:00 30 mg via INTRAVENOUS

## 2018-11-15 MED ORDER — GABAPENTIN 300 MG PO CAP
600 mg | Freq: Once | ORAL | 0 refills | Status: CP
Start: 2018-11-15 — End: ?
  Administered 2018-11-15: 20:00:00 600 mg via ORAL

## 2018-11-15 MED ORDER — LORAZEPAM 0.5 MG PO TAB
.5 mg | ORAL | 0 refills | Status: DC | PRN
Start: 2018-11-15 — End: 2018-11-16

## 2018-11-15 MED ORDER — TRAZODONE 50 MG PO TAB
100 mg | Freq: Every evening | ORAL | 0 refills | Status: DC
Start: 2018-11-15 — End: 2018-11-16
  Administered 2018-11-16: 02:00:00 100 mg via ORAL

## 2018-11-15 MED ORDER — GABAPENTIN 300 MG PO CAP
600 mg | Freq: Three times a day (TID) | ORAL | 0 refills | Status: DC
Start: 2018-11-15 — End: 2018-11-16
  Administered 2018-11-16 (×2): 600 mg via ORAL

## 2018-11-15 MED ORDER — DULOXETINE 30 MG PO CPDR
30 mg | Freq: Every day | ORAL | 0 refills | Status: DC
Start: 2018-11-15 — End: 2018-11-16
  Administered 2018-11-16 (×2): 30 mg via ORAL

## 2018-11-15 MED ORDER — HALOPERIDOL LACTATE 5 MG/ML IJ SOLN
1 mg | Freq: Once | INTRAVENOUS | 0 refills | Status: DC | PRN
Start: 2018-11-15 — End: 2018-11-16

## 2018-11-15 MED ORDER — OXYCODONE 5 MG PO TAB
5-10 mg | Freq: Once | ORAL | 0 refills | Status: CP | PRN
Start: 2018-11-15 — End: ?
  Administered 2018-11-15: 19:00:00 10 mg via ORAL

## 2018-11-15 MED ADMIN — OXYCODONE 5 MG PO TAB [10814]: 10 mg | ORAL | Stop: 2018-11-15 | NDC 00406055223

## 2018-11-15 MED ADMIN — LACTATED RINGERS IV SOLP [4318]: INTRAVENOUS | @ 16:00:00 | Stop: 2018-11-15 | NDC 00338011704

## 2018-11-16 ENCOUNTER — Ambulatory Visit: Admit: 2018-11-13 | Discharge: 2018-11-13 | Payer: MEDICARE

## 2018-11-16 ENCOUNTER — Ambulatory Visit: Admit: 2018-11-15 | Discharge: 2018-11-15 | Payer: MEDICARE

## 2018-11-16 ENCOUNTER — Encounter: Admit: 2018-11-16 | Discharge: 2018-11-16 | Payer: MEDICARE

## 2018-11-16 DIAGNOSIS — Z9889 Other specified postprocedural states: Secondary | ICD-10-CM

## 2018-11-16 DIAGNOSIS — J449 Chronic obstructive pulmonary disease, unspecified: Secondary | ICD-10-CM

## 2018-11-16 DIAGNOSIS — F419 Anxiety disorder, unspecified: Secondary | ICD-10-CM

## 2018-11-16 DIAGNOSIS — Z9884 Bariatric surgery status: Secondary | ICD-10-CM

## 2018-11-16 DIAGNOSIS — E271 Primary adrenocortical insufficiency: Secondary | ICD-10-CM

## 2018-11-16 DIAGNOSIS — Z7982 Long term (current) use of aspirin: Secondary | ICD-10-CM

## 2018-11-16 DIAGNOSIS — M793 Panniculitis, unspecified: Principal | ICD-10-CM

## 2018-11-16 DIAGNOSIS — F329 Major depressive disorder, single episode, unspecified: Secondary | ICD-10-CM

## 2018-11-16 MED ORDER — ONDANSETRON HCL 4 MG PO TAB
4 mg | ORAL | 0 refills | Status: DC | PRN
Start: 2018-11-16 — End: 2018-11-16

## 2018-11-16 MED ORDER — ACETAMINOPHEN 500 MG PO TAB
1000 mg | ORAL_TABLET | ORAL | 0 refills | Status: AC
Start: 2018-11-16 — End: ?

## 2018-11-16 MED ORDER — DOCUSATE SODIUM 100 MG PO CAP
100 mg | Freq: Every day | ORAL | 0 refills | Status: DC | PRN
Start: 2018-11-16 — End: 2018-11-16

## 2018-11-16 MED ORDER — OXYCODONE 5 MG PO TAB
5-15 mg | ORAL_TABLET | ORAL | 0 refills | 6.00000 days | Status: AC | PRN
Start: 2018-11-16 — End: 2019-10-14
  Filled 2018-11-16 (×2): qty 35, 6d supply, fill #1

## 2018-11-16 MED ORDER — SENNOSIDES 8.6 MG PO TAB
1 | ORAL_TABLET | Freq: Two times a day (BID) | ORAL | 1 refills | Status: AC
Start: 2018-11-16 — End: 2020-05-20

## 2018-11-16 MED ORDER — BACLOFEN 10 MG PO TAB
10 mg | ORAL_TABLET | ORAL | 0 refills | 30.00000 days | Status: AC
Start: 2018-11-16 — End: ?
  Filled 2018-11-16 (×2): qty 120, 30d supply, fill #1

## 2018-11-16 MED ORDER — ONDANSETRON HCL (PF) 4 MG/2 ML IJ SOLN
4 mg | INTRAVENOUS | 0 refills | Status: DC | PRN
Start: 2018-11-16 — End: 2018-11-16

## 2018-11-16 MED ADMIN — ACETAMINOPHEN 325 MG PO TAB [101]: 650 mg | ORAL | @ 01:00:00 | Stop: 2018-11-16 | NDC 50580060002

## 2018-11-17 ENCOUNTER — Encounter: Admit: 2018-11-17 | Discharge: 2018-11-17 | Payer: MEDICARE

## 2018-11-17 DIAGNOSIS — M1990 Unspecified osteoarthritis, unspecified site: ICD-10-CM

## 2018-11-17 DIAGNOSIS — G629 Polyneuropathy, unspecified: ICD-10-CM

## 2018-11-17 DIAGNOSIS — G8929 Other chronic pain: ICD-10-CM

## 2018-11-17 DIAGNOSIS — G43909 Migraine, unspecified, not intractable, without status migrainosus: ICD-10-CM

## 2018-11-17 DIAGNOSIS — S99922S Unspecified injury of left foot, sequela: ICD-10-CM

## 2018-11-17 DIAGNOSIS — F419 Anxiety disorder, unspecified: ICD-10-CM

## 2018-11-17 DIAGNOSIS — E271 Primary adrenocortical insufficiency: ICD-10-CM

## 2018-11-17 DIAGNOSIS — J449 Chronic obstructive pulmonary disease, unspecified: ICD-10-CM

## 2018-11-17 DIAGNOSIS — M5136 Other intervertebral disc degeneration, lumbar region: ICD-10-CM

## 2018-11-17 DIAGNOSIS — R1110 Vomiting, unspecified: ICD-10-CM

## 2018-11-17 DIAGNOSIS — K219 Gastro-esophageal reflux disease without esophagitis: ICD-10-CM

## 2018-11-17 DIAGNOSIS — J45909 Unspecified asthma, uncomplicated: ICD-10-CM

## 2018-11-17 DIAGNOSIS — E610 Copper deficiency: ICD-10-CM

## 2018-11-17 DIAGNOSIS — N200 Calculus of kidney: ICD-10-CM

## 2018-11-17 DIAGNOSIS — J42 Unspecified chronic bronchitis: ICD-10-CM

## 2018-11-17 DIAGNOSIS — S069X9A Unspecified intracranial injury with loss of consciousness of unspecified duration, initial encounter: ICD-10-CM

## 2018-11-17 DIAGNOSIS — S329XXA Fracture of unspecified parts of lumbosacral spine and pelvis, initial encounter for closed fracture: ICD-10-CM

## 2018-11-17 DIAGNOSIS — I951 Orthostatic hypotension: Principal | ICD-10-CM

## 2018-11-17 DIAGNOSIS — Z9884 Bariatric surgery status: ICD-10-CM

## 2018-11-17 DIAGNOSIS — F329 Major depressive disorder, single episode, unspecified: ICD-10-CM

## 2018-11-17 DIAGNOSIS — G4700 Insomnia, unspecified: ICD-10-CM

## 2018-11-28 ENCOUNTER — Ambulatory Visit: Admit: 2018-11-28 | Discharge: 2018-11-29 | Payer: MEDICARE

## 2018-11-28 ENCOUNTER — Encounter: Admit: 2018-11-28 | Discharge: 2018-11-28 | Payer: MEDICARE

## 2018-11-28 DIAGNOSIS — E271 Primary adrenocortical insufficiency: ICD-10-CM

## 2018-11-28 DIAGNOSIS — S99922S Unspecified injury of left foot, sequela: ICD-10-CM

## 2018-11-28 DIAGNOSIS — G8929 Other chronic pain: ICD-10-CM

## 2018-11-28 DIAGNOSIS — G43909 Migraine, unspecified, not intractable, without status migrainosus: ICD-10-CM

## 2018-11-28 DIAGNOSIS — E610 Copper deficiency: ICD-10-CM

## 2018-11-28 DIAGNOSIS — I951 Orthostatic hypotension: Principal | ICD-10-CM

## 2018-11-28 DIAGNOSIS — K219 Gastro-esophageal reflux disease without esophagitis: ICD-10-CM

## 2018-11-28 DIAGNOSIS — F419 Anxiety disorder, unspecified: ICD-10-CM

## 2018-11-28 DIAGNOSIS — M5136 Other intervertebral disc degeneration, lumbar region: ICD-10-CM

## 2018-11-28 DIAGNOSIS — R1110 Vomiting, unspecified: ICD-10-CM

## 2018-11-28 DIAGNOSIS — G4700 Insomnia, unspecified: ICD-10-CM

## 2018-11-28 DIAGNOSIS — G629 Polyneuropathy, unspecified: ICD-10-CM

## 2018-11-28 DIAGNOSIS — S329XXA Fracture of unspecified parts of lumbosacral spine and pelvis, initial encounter for closed fracture: ICD-10-CM

## 2018-11-28 DIAGNOSIS — S069X9A Unspecified intracranial injury with loss of consciousness of unspecified duration, initial encounter: ICD-10-CM

## 2018-11-28 DIAGNOSIS — Z9884 Bariatric surgery status: ICD-10-CM

## 2018-11-28 DIAGNOSIS — J449 Chronic obstructive pulmonary disease, unspecified: ICD-10-CM

## 2018-11-28 DIAGNOSIS — F329 Major depressive disorder, single episode, unspecified: ICD-10-CM

## 2018-11-28 DIAGNOSIS — Z9889 Other specified postprocedural states: Principal | ICD-10-CM

## 2018-11-28 DIAGNOSIS — N200 Calculus of kidney: ICD-10-CM

## 2018-11-28 DIAGNOSIS — M1990 Unspecified osteoarthritis, unspecified site: ICD-10-CM

## 2018-11-28 DIAGNOSIS — J45909 Unspecified asthma, uncomplicated: ICD-10-CM

## 2018-11-28 DIAGNOSIS — J42 Unspecified chronic bronchitis: ICD-10-CM

## 2018-12-10 ENCOUNTER — Ambulatory Visit: Admit: 2018-12-10 | Discharge: 2018-12-10 | Payer: MEDICARE

## 2018-12-10 ENCOUNTER — Encounter: Admit: 2018-12-10 | Discharge: 2018-12-10 | Payer: MEDICARE

## 2018-12-10 DIAGNOSIS — S069X9A Unspecified intracranial injury with loss of consciousness of unspecified duration, initial encounter: ICD-10-CM

## 2018-12-10 DIAGNOSIS — G8929 Other chronic pain: ICD-10-CM

## 2018-12-10 DIAGNOSIS — F329 Major depressive disorder, single episode, unspecified: ICD-10-CM

## 2018-12-10 DIAGNOSIS — J42 Unspecified chronic bronchitis: ICD-10-CM

## 2018-12-10 DIAGNOSIS — I951 Orthostatic hypotension: ICD-10-CM

## 2018-12-10 DIAGNOSIS — M5136 Other intervertebral disc degeneration, lumbar region: ICD-10-CM

## 2018-12-10 DIAGNOSIS — J4530 Mild persistent asthma, uncomplicated: Principal | ICD-10-CM

## 2018-12-10 DIAGNOSIS — M1990 Unspecified osteoarthritis, unspecified site: ICD-10-CM

## 2018-12-10 DIAGNOSIS — Z9884 Bariatric surgery status: ICD-10-CM

## 2018-12-10 DIAGNOSIS — G629 Polyneuropathy, unspecified: ICD-10-CM

## 2018-12-10 DIAGNOSIS — N200 Calculus of kidney: ICD-10-CM

## 2018-12-10 DIAGNOSIS — R1110 Vomiting, unspecified: ICD-10-CM

## 2018-12-10 DIAGNOSIS — J449 Chronic obstructive pulmonary disease, unspecified: ICD-10-CM

## 2018-12-10 DIAGNOSIS — R001 Bradycardia, unspecified: ICD-10-CM

## 2018-12-10 DIAGNOSIS — F419 Anxiety disorder, unspecified: ICD-10-CM

## 2018-12-10 DIAGNOSIS — E271 Primary adrenocortical insufficiency: ICD-10-CM

## 2018-12-10 DIAGNOSIS — S99922S Unspecified injury of left foot, sequela: ICD-10-CM

## 2018-12-10 DIAGNOSIS — R931 Abnormal findings on diagnostic imaging of heart and coronary circulation: ICD-10-CM

## 2018-12-10 DIAGNOSIS — J189 Pneumonia, unspecified organism: ICD-10-CM

## 2018-12-10 DIAGNOSIS — M359 Systemic involvement of connective tissue, unspecified: ICD-10-CM

## 2018-12-10 DIAGNOSIS — J45909 Unspecified asthma, uncomplicated: ICD-10-CM

## 2018-12-10 DIAGNOSIS — E610 Copper deficiency: ICD-10-CM

## 2018-12-10 DIAGNOSIS — J302 Other seasonal allergic rhinitis: ICD-10-CM

## 2018-12-10 DIAGNOSIS — S329XXA Fracture of unspecified parts of lumbosacral spine and pelvis, initial encounter for closed fracture: ICD-10-CM

## 2018-12-10 DIAGNOSIS — G43909 Migraine, unspecified, not intractable, without status migrainosus: ICD-10-CM

## 2018-12-10 DIAGNOSIS — G4700 Insomnia, unspecified: ICD-10-CM

## 2018-12-10 DIAGNOSIS — K219 Gastro-esophageal reflux disease without esophagitis: ICD-10-CM

## 2018-12-11 ENCOUNTER — Encounter: Admit: 2018-12-11 | Discharge: 2018-12-11 | Payer: MEDICARE

## 2018-12-11 DIAGNOSIS — I951 Orthostatic hypotension: Principal | ICD-10-CM

## 2018-12-11 DIAGNOSIS — M1990 Unspecified osteoarthritis, unspecified site: ICD-10-CM

## 2018-12-11 DIAGNOSIS — J302 Other seasonal allergic rhinitis: ICD-10-CM

## 2018-12-11 DIAGNOSIS — G8929 Other chronic pain: ICD-10-CM

## 2018-12-11 DIAGNOSIS — S329XXA Fracture of unspecified parts of lumbosacral spine and pelvis, initial encounter for closed fracture: ICD-10-CM

## 2018-12-11 DIAGNOSIS — J189 Pneumonia, unspecified organism: ICD-10-CM

## 2018-12-11 DIAGNOSIS — F329 Major depressive disorder, single episode, unspecified: ICD-10-CM

## 2018-12-11 DIAGNOSIS — F419 Anxiety disorder, unspecified: ICD-10-CM

## 2018-12-11 DIAGNOSIS — R1110 Vomiting, unspecified: ICD-10-CM

## 2018-12-11 DIAGNOSIS — G629 Polyneuropathy, unspecified: ICD-10-CM

## 2018-12-11 DIAGNOSIS — K219 Gastro-esophageal reflux disease without esophagitis: ICD-10-CM

## 2018-12-11 DIAGNOSIS — J449 Chronic obstructive pulmonary disease, unspecified: ICD-10-CM

## 2018-12-11 DIAGNOSIS — J42 Unspecified chronic bronchitis: ICD-10-CM

## 2018-12-11 DIAGNOSIS — M359 Systemic involvement of connective tissue, unspecified: ICD-10-CM

## 2018-12-11 DIAGNOSIS — S069X9A Unspecified intracranial injury with loss of consciousness of unspecified duration, initial encounter: ICD-10-CM

## 2018-12-11 DIAGNOSIS — G4700 Insomnia, unspecified: ICD-10-CM

## 2018-12-11 DIAGNOSIS — J45909 Unspecified asthma, uncomplicated: ICD-10-CM

## 2018-12-11 DIAGNOSIS — E610 Copper deficiency: ICD-10-CM

## 2018-12-11 DIAGNOSIS — S99922S Unspecified injury of left foot, sequela: ICD-10-CM

## 2018-12-11 DIAGNOSIS — N200 Calculus of kidney: ICD-10-CM

## 2018-12-11 DIAGNOSIS — E271 Primary adrenocortical insufficiency: ICD-10-CM

## 2018-12-11 DIAGNOSIS — M5136 Other intervertebral disc degeneration, lumbar region: ICD-10-CM

## 2018-12-11 DIAGNOSIS — Z9884 Bariatric surgery status: ICD-10-CM

## 2018-12-11 DIAGNOSIS — G43909 Migraine, unspecified, not intractable, without status migrainosus: ICD-10-CM

## 2018-12-16 ENCOUNTER — Encounter: Admit: 2018-12-16 | Discharge: 2018-12-16 | Payer: MEDICARE

## 2018-12-16 NOTE — Telephone Encounter
Records request faxed to [facility] to obtain all DC summary reports from patient being hospitalized for pneumonia. Routing to Clemens Catholic, Charity fundraiser.   Richardson Dopp, MA

## 2019-01-30 ENCOUNTER — Encounter: Admit: 2019-01-30 | Discharge: 2019-01-30 | Payer: MEDICARE

## 2019-02-03 ENCOUNTER — Ambulatory Visit: Admit: 2019-02-03 | Discharge: 2019-02-04 | Payer: MEDICARE

## 2019-02-03 ENCOUNTER — Encounter: Admit: 2019-02-03 | Discharge: 2019-02-03 | Payer: MEDICARE

## 2019-02-03 DIAGNOSIS — S99922S Unspecified injury of left foot, sequela: ICD-10-CM

## 2019-02-03 DIAGNOSIS — F329 Major depressive disorder, single episode, unspecified: ICD-10-CM

## 2019-02-03 DIAGNOSIS — G4700 Insomnia, unspecified: ICD-10-CM

## 2019-02-03 DIAGNOSIS — I951 Orthostatic hypotension: Secondary | ICD-10-CM

## 2019-02-03 DIAGNOSIS — M359 Systemic involvement of connective tissue, unspecified: ICD-10-CM

## 2019-02-03 DIAGNOSIS — M5136 Other intervertebral disc degeneration, lumbar region: ICD-10-CM

## 2019-02-03 DIAGNOSIS — F419 Anxiety disorder, unspecified: ICD-10-CM

## 2019-02-03 DIAGNOSIS — M1990 Unspecified osteoarthritis, unspecified site: ICD-10-CM

## 2019-02-03 DIAGNOSIS — S069X9A Unspecified intracranial injury with loss of consciousness of unspecified duration, initial encounter: ICD-10-CM

## 2019-02-03 DIAGNOSIS — G8929 Other chronic pain: ICD-10-CM

## 2019-02-03 DIAGNOSIS — E610 Copper deficiency: ICD-10-CM

## 2019-02-03 DIAGNOSIS — J42 Unspecified chronic bronchitis: ICD-10-CM

## 2019-02-03 DIAGNOSIS — G629 Polyneuropathy, unspecified: ICD-10-CM

## 2019-02-03 DIAGNOSIS — S329XXA Fracture of unspecified parts of lumbosacral spine and pelvis, initial encounter for closed fracture: ICD-10-CM

## 2019-02-03 DIAGNOSIS — J449 Chronic obstructive pulmonary disease, unspecified: ICD-10-CM

## 2019-02-03 DIAGNOSIS — J45909 Unspecified asthma, uncomplicated: ICD-10-CM

## 2019-02-03 DIAGNOSIS — J302 Other seasonal allergic rhinitis: ICD-10-CM

## 2019-02-03 DIAGNOSIS — R1110 Vomiting, unspecified: ICD-10-CM

## 2019-02-03 DIAGNOSIS — J189 Pneumonia, unspecified organism: ICD-10-CM

## 2019-02-03 DIAGNOSIS — K219 Gastro-esophageal reflux disease without esophagitis: ICD-10-CM

## 2019-02-03 DIAGNOSIS — E271 Primary adrenocortical insufficiency: Principal | ICD-10-CM

## 2019-02-03 DIAGNOSIS — N200 Calculus of kidney: ICD-10-CM

## 2019-02-03 DIAGNOSIS — Z9884 Bariatric surgery status: ICD-10-CM

## 2019-02-03 DIAGNOSIS — G43909 Migraine, unspecified, not intractable, without status migrainosus: ICD-10-CM

## 2019-02-03 MED ORDER — COSYNTROPIN 0.25 MG IJ SOLR
0.25 mg | Freq: Once | INTRAVENOUS | 0 refills | Status: CN
Start: 2019-02-03 — End: ?

## 2019-02-03 NOTE — Progress Notes
???   Marital status: Single     Spouse name: Not on file   ??? Number of children: 1   ??? Years of education: Not on file   ??? Highest education level: Not on file   Occupational History   ??? Not on file   Tobacco Use   ??? Smoking status: Never Smoker   ??? Smokeless tobacco: Never Used   Substance and Sexual Activity   ??? Alcohol use: Yes     Frequency: Monthly or less   ??? Drug use: Never   ??? Sexual activity: Not on file   Other Topics Concern   ??? Not on file   Social History Narrative    Lives alone, not working, on disability from chronic pain and TBI         Objective:         ??? acetaminophen (TYLENOL) 500 mg tablet Take two tablets by mouth every 6 hours. Max of 4,000 mg of acetaminophen in 24 hours.    Please take 2 Tablets (1000 mg) every 6 hrs for the first three days after your procedure. After three days, you may take Tylenol as needed.   ??? albuterol 0.5% (PROVENTIL) 2.5 mg/0.5 mL nebulizer solution Inhale 2.5 mg solution by nebulizer as directed every 6 hours as needed for Shortness of Breath or Wheezing.   ??? albuterol sulfate (PROAIR HFA) 90 mcg/actuation aerosol inhaler Inhale 2 puffs by mouth into the lungs every 6 hours as needed for Wheezing or Shortness of Breath. Shake well before use.   ??? aspirin EC 81 mg tablet Take 81 mg by mouth daily.   ??? baclofen (LIORESAL) 10 mg tablet Take one tablet by mouth every 6 hours.   ??? baclofen (LIORESAL) 10 mg tablet Take 10 mg by mouth four times daily as needed.   ??? biotin 1,000 mcg chew Chew  by mouth.   ??? budesonide-formoteroL (SYMBICORT HFA) 160-4.5 mcg/actuation inhalation Inhale 2 puffs by mouth into the lungs twice daily.   ??? calcium carbonate (CALCIUM 600 PO) Take  by mouth.   ??? cetirizine (ZYRTEC) 10 mg tablet Take 10 mg by mouth daily.   ??? cholecalciferol (VITAMIN D-3) 125 mcg (5,000 unit) tablet Take 5,000 Units by mouth daily.   ??? cholecalciferol(+) (Vitamin D3) 50,000 units capsule Take 50,000 Units by mouth twice weekly. for this and explained to the patient.  This will be done at the [facility]      Orthostatic hypotension  Syncope due to orthostatic hypotension  Unclear how much this is improved with Florinef.  She has seen a cardiologist for this, who does not recall details.    I will notify her when cosyntropin stim test results.  We will make further plans at that time.    Pts phone number [phone]    Time: 9:20-10:20

## 2019-02-07 ENCOUNTER — Encounter: Admit: 2019-02-07 | Discharge: 2019-02-07 | Payer: MEDICARE

## 2019-02-07 NOTE — Telephone Encounter
Yes please order if not already ordered.  Thanks so much

## 2019-02-12 ENCOUNTER — Encounter: Admit: 2019-02-12 | Discharge: 2019-02-12 | Payer: MEDICARE

## 2019-02-18 ENCOUNTER — Encounter: Admit: 2019-02-18 | Discharge: 2019-02-18 | Payer: MEDICARE

## 2019-02-18 ENCOUNTER — Ambulatory Visit: Admit: 2019-02-18 | Discharge: 2019-02-19 | Payer: MEDICARE

## 2019-02-18 DIAGNOSIS — S069X9A Unspecified intracranial injury with loss of consciousness of unspecified duration, initial encounter: ICD-10-CM

## 2019-02-18 DIAGNOSIS — F419 Anxiety disorder, unspecified: ICD-10-CM

## 2019-02-18 DIAGNOSIS — G43909 Migraine, unspecified, not intractable, without status migrainosus: ICD-10-CM

## 2019-02-18 DIAGNOSIS — F329 Major depressive disorder, single episode, unspecified: ICD-10-CM

## 2019-02-18 DIAGNOSIS — M1990 Unspecified osteoarthritis, unspecified site: ICD-10-CM

## 2019-02-18 DIAGNOSIS — G4700 Insomnia, unspecified: ICD-10-CM

## 2019-02-18 DIAGNOSIS — J449 Chronic obstructive pulmonary disease, unspecified: ICD-10-CM

## 2019-02-18 DIAGNOSIS — E610 Copper deficiency: ICD-10-CM

## 2019-02-18 DIAGNOSIS — N200 Calculus of kidney: ICD-10-CM

## 2019-02-18 DIAGNOSIS — S329XXA Fracture of unspecified parts of lumbosacral spine and pelvis, initial encounter for closed fracture: ICD-10-CM

## 2019-02-18 DIAGNOSIS — J42 Unspecified chronic bronchitis: ICD-10-CM

## 2019-02-18 DIAGNOSIS — G629 Polyneuropathy, unspecified: ICD-10-CM

## 2019-02-18 DIAGNOSIS — S99922S Unspecified injury of left foot, sequela: ICD-10-CM

## 2019-02-18 DIAGNOSIS — R1110 Vomiting, unspecified: ICD-10-CM

## 2019-02-18 DIAGNOSIS — G8929 Other chronic pain: ICD-10-CM

## 2019-02-18 DIAGNOSIS — J45909 Unspecified asthma, uncomplicated: ICD-10-CM

## 2019-02-18 DIAGNOSIS — J189 Pneumonia, unspecified organism: ICD-10-CM

## 2019-02-18 DIAGNOSIS — I951 Orthostatic hypotension: Principal | ICD-10-CM

## 2019-02-18 DIAGNOSIS — E271 Primary adrenocortical insufficiency: ICD-10-CM

## 2019-02-18 DIAGNOSIS — J302 Other seasonal allergic rhinitis: ICD-10-CM

## 2019-02-18 DIAGNOSIS — Z9884 Bariatric surgery status: ICD-10-CM

## 2019-02-18 DIAGNOSIS — K219 Gastro-esophageal reflux disease without esophagitis: ICD-10-CM

## 2019-02-18 DIAGNOSIS — M359 Systemic involvement of connective tissue, unspecified: ICD-10-CM

## 2019-02-18 DIAGNOSIS — M5136 Other intervertebral disc degeneration, lumbar region: ICD-10-CM

## 2019-02-18 LAB — CORTISOL BASELINE: Lab: 6.2 ug/dL (ref >60)

## 2019-02-18 LAB — ACTH: Lab: 7 pg/mL (ref 7–63)

## 2019-02-18 LAB — CORTISOL 60 MINUTES POST: Lab: 22 ug/dL (ref 0–0.45)

## 2019-02-18 LAB — CORTISOL 30 MINUTES POST: Lab: 18 ug/dL — ABNORMAL HIGH (ref >60)

## 2019-02-18 MED ORDER — COSYNTROPIN 0.25 MG IJ SOLR
0.25 mg | Freq: Once | INTRAVENOUS | 0 refills | Status: CP
Start: 2019-02-18 — End: ?
  Administered 2019-02-18: 14:00:00 0.25 mg via INTRAVENOUS

## 2019-02-18 MED ORDER — COSYNTROPIN 0.25 MG IJ SOLR
0.25 mg | Freq: Once | INTRAVENOUS | 0 refills | Status: CN
Start: 2019-02-18 — End: ?

## 2019-02-19 DIAGNOSIS — E271 Primary adrenocortical insufficiency: Principal | ICD-10-CM

## 2019-02-20 ENCOUNTER — Encounter: Admit: 2019-02-20 | Discharge: 2019-02-20 | Payer: MEDICARE

## 2019-02-24 ENCOUNTER — Encounter: Admit: 2019-02-24 | Discharge: 2019-02-24 | Payer: MEDICARE

## 2019-02-25 NOTE — Telephone Encounter
Pt's daughter inquiring to lab results for her mom's lab results  I verbalized that I would call with results when Dr. Okey Dupre provided me the information  She asked to be called at [phone]    Dr. Okey Dupre please advise, thank you

## 2019-02-26 NOTE — Telephone Encounter
???  ???  I think I left a VM with results, though didn't document it.  I also sent a letter to her PCP with the results last week.  The results are normal.  She does not have adrenal insufficiency, so her adrenal glands respond normally to stress and are not be the cause of her symptoms.  The hydrocortisone is making her feel better, but is not treating the real cause of her symptoms.    ??? Ref. Range 02/18/2019 08:32 02/18/2019 09:24 02/18/2019 09:57   Adrenocorticotropic Hormone Latest Ref Range: 7 - 63 pg/mL 7 ??? ???   Cortisol 30 Min Latest Units: ug/dL ??? 18.4 ???   Cortisol 60 Min Latest Units: ug/dL ??? ??? 16.1   Cortisol Baseline Latest Units: ug/dL 6.2 ??? ???

## 2019-02-26 NOTE — Telephone Encounter
Contacted pt and daughter, message provided  Understanding verbalized.  They will follow up with PCP for further recommendations    Closing encounter

## 2019-07-03 ENCOUNTER — Encounter: Admit: 2019-07-03 | Discharge: 2019-07-03 | Payer: MEDICARE

## 2019-09-04 ENCOUNTER — Encounter: Admit: 2019-09-04 | Discharge: 2019-09-04 | Payer: MEDICARE

## 2019-09-04 ENCOUNTER — Ambulatory Visit: Admit: 2019-09-04 | Discharge: 2019-09-04 | Payer: MEDICARE

## 2019-09-04 DIAGNOSIS — R1110 Vomiting, unspecified: Secondary | ICD-10-CM

## 2019-09-04 DIAGNOSIS — K219 Gastro-esophageal reflux disease without esophagitis: Secondary | ICD-10-CM

## 2019-09-04 DIAGNOSIS — S329XXA Fracture of unspecified parts of lumbosacral spine and pelvis, initial encounter for closed fracture: Secondary | ICD-10-CM

## 2019-09-04 DIAGNOSIS — J449 Chronic obstructive pulmonary disease, unspecified: Secondary | ICD-10-CM

## 2019-09-04 DIAGNOSIS — E610 Copper deficiency: Secondary | ICD-10-CM

## 2019-09-04 DIAGNOSIS — F419 Anxiety disorder, unspecified: Secondary | ICD-10-CM

## 2019-09-04 DIAGNOSIS — S99922S Unspecified injury of left foot, sequela: Secondary | ICD-10-CM

## 2019-09-04 DIAGNOSIS — J45909 Unspecified asthma, uncomplicated: Secondary | ICD-10-CM

## 2019-09-04 DIAGNOSIS — E271 Primary adrenocortical insufficiency: Secondary | ICD-10-CM

## 2019-09-04 DIAGNOSIS — N200 Calculus of kidney: Secondary | ICD-10-CM

## 2019-09-04 DIAGNOSIS — J189 Pneumonia, unspecified organism: Secondary | ICD-10-CM

## 2019-09-04 DIAGNOSIS — G629 Polyneuropathy, unspecified: Secondary | ICD-10-CM

## 2019-09-04 DIAGNOSIS — I951 Orthostatic hypotension: Secondary | ICD-10-CM

## 2019-09-04 DIAGNOSIS — M1990 Unspecified osteoarthritis, unspecified site: Secondary | ICD-10-CM

## 2019-09-04 DIAGNOSIS — G4700 Insomnia, unspecified: Secondary | ICD-10-CM

## 2019-09-04 DIAGNOSIS — S069X9A Unspecified intracranial injury with loss of consciousness of unspecified duration, initial encounter: Secondary | ICD-10-CM

## 2019-09-04 DIAGNOSIS — M5136 Other intervertebral disc degeneration, lumbar region: Secondary | ICD-10-CM

## 2019-09-04 DIAGNOSIS — J302 Other seasonal allergic rhinitis: Secondary | ICD-10-CM

## 2019-09-04 DIAGNOSIS — Z9884 Bariatric surgery status: Secondary | ICD-10-CM

## 2019-09-04 DIAGNOSIS — J42 Unspecified chronic bronchitis: Secondary | ICD-10-CM

## 2019-09-04 DIAGNOSIS — G43909 Migraine, unspecified, not intractable, without status migrainosus: Secondary | ICD-10-CM

## 2019-09-04 DIAGNOSIS — G8921 Chronic pain due to trauma: Secondary | ICD-10-CM

## 2019-09-04 DIAGNOSIS — M359 Systemic involvement of connective tissue, unspecified: Secondary | ICD-10-CM

## 2019-09-04 DIAGNOSIS — F329 Major depressive disorder, single episode, unspecified: Secondary | ICD-10-CM

## 2019-09-18 ENCOUNTER — Encounter: Admit: 2019-09-18 | Discharge: 2019-09-18 | Payer: MEDICARE

## 2019-09-18 DIAGNOSIS — M359 Systemic involvement of connective tissue, unspecified: Secondary | ICD-10-CM

## 2019-09-18 DIAGNOSIS — Z9884 Bariatric surgery status: Secondary | ICD-10-CM

## 2019-09-18 DIAGNOSIS — J302 Other seasonal allergic rhinitis: Secondary | ICD-10-CM

## 2019-09-18 DIAGNOSIS — M5136 Other intervertebral disc degeneration, lumbar region: Secondary | ICD-10-CM

## 2019-09-18 DIAGNOSIS — F329 Major depressive disorder, single episode, unspecified: Secondary | ICD-10-CM

## 2019-09-18 DIAGNOSIS — J45909 Unspecified asthma, uncomplicated: Secondary | ICD-10-CM

## 2019-09-18 DIAGNOSIS — F419 Anxiety disorder, unspecified: Secondary | ICD-10-CM

## 2019-09-18 DIAGNOSIS — J449 Chronic obstructive pulmonary disease, unspecified: Secondary | ICD-10-CM

## 2019-09-18 DIAGNOSIS — G629 Polyneuropathy, unspecified: Secondary | ICD-10-CM

## 2019-09-18 DIAGNOSIS — E610 Copper deficiency: Secondary | ICD-10-CM

## 2019-09-18 DIAGNOSIS — G8921 Chronic pain due to trauma: Secondary | ICD-10-CM

## 2019-09-18 DIAGNOSIS — J42 Unspecified chronic bronchitis: Secondary | ICD-10-CM

## 2019-09-18 DIAGNOSIS — I951 Orthostatic hypotension: Secondary | ICD-10-CM

## 2019-09-18 DIAGNOSIS — S069X9A Unspecified intracranial injury with loss of consciousness of unspecified duration, initial encounter: Secondary | ICD-10-CM

## 2019-09-18 DIAGNOSIS — S99922S Unspecified injury of left foot, sequela: Secondary | ICD-10-CM

## 2019-09-18 DIAGNOSIS — G43909 Migraine, unspecified, not intractable, without status migrainosus: Secondary | ICD-10-CM

## 2019-09-18 DIAGNOSIS — J189 Pneumonia, unspecified organism: Secondary | ICD-10-CM

## 2019-09-18 DIAGNOSIS — N200 Calculus of kidney: Secondary | ICD-10-CM

## 2019-09-18 DIAGNOSIS — G4700 Insomnia, unspecified: Secondary | ICD-10-CM

## 2019-09-18 DIAGNOSIS — R1110 Vomiting, unspecified: Secondary | ICD-10-CM

## 2019-09-18 DIAGNOSIS — S329XXA Fracture of unspecified parts of lumbosacral spine and pelvis, initial encounter for closed fracture: Secondary | ICD-10-CM

## 2019-09-18 DIAGNOSIS — M1990 Unspecified osteoarthritis, unspecified site: Secondary | ICD-10-CM

## 2019-09-18 DIAGNOSIS — E271 Primary adrenocortical insufficiency: Secondary | ICD-10-CM

## 2019-09-18 DIAGNOSIS — K219 Gastro-esophageal reflux disease without esophagitis: Secondary | ICD-10-CM

## 2019-09-23 ENCOUNTER — Encounter: Admit: 2019-09-23 | Discharge: 2019-09-23 | Payer: MEDICARE

## 2019-09-23 DIAGNOSIS — S99922S Unspecified injury of left foot, sequela: Secondary | ICD-10-CM

## 2019-09-23 DIAGNOSIS — I951 Orthostatic hypotension: Secondary | ICD-10-CM

## 2019-09-23 DIAGNOSIS — J302 Other seasonal allergic rhinitis: Secondary | ICD-10-CM

## 2019-09-23 DIAGNOSIS — F419 Anxiety disorder, unspecified: Secondary | ICD-10-CM

## 2019-09-23 DIAGNOSIS — R1110 Vomiting, unspecified: Secondary | ICD-10-CM

## 2019-09-23 DIAGNOSIS — F329 Major depressive disorder, single episode, unspecified: Secondary | ICD-10-CM

## 2019-09-23 DIAGNOSIS — M5136 Other intervertebral disc degeneration, lumbar region: Secondary | ICD-10-CM

## 2019-09-23 DIAGNOSIS — G43909 Migraine, unspecified, not intractable, without status migrainosus: Secondary | ICD-10-CM

## 2019-09-23 DIAGNOSIS — J45909 Unspecified asthma, uncomplicated: Secondary | ICD-10-CM

## 2019-09-23 DIAGNOSIS — M1990 Unspecified osteoarthritis, unspecified site: Secondary | ICD-10-CM

## 2019-09-23 DIAGNOSIS — E610 Copper deficiency: Secondary | ICD-10-CM

## 2019-09-23 DIAGNOSIS — G4700 Insomnia, unspecified: Secondary | ICD-10-CM

## 2019-09-23 DIAGNOSIS — N200 Calculus of kidney: Secondary | ICD-10-CM

## 2019-09-23 DIAGNOSIS — S329XXA Fracture of unspecified parts of lumbosacral spine and pelvis, initial encounter for closed fracture: Secondary | ICD-10-CM

## 2019-09-23 DIAGNOSIS — J189 Pneumonia, unspecified organism: Secondary | ICD-10-CM

## 2019-09-23 DIAGNOSIS — J42 Unspecified chronic bronchitis: Secondary | ICD-10-CM

## 2019-09-23 DIAGNOSIS — E271 Primary adrenocortical insufficiency: Secondary | ICD-10-CM

## 2019-09-23 DIAGNOSIS — S069X9A Unspecified intracranial injury with loss of consciousness of unspecified duration, initial encounter: Secondary | ICD-10-CM

## 2019-09-23 DIAGNOSIS — K219 Gastro-esophageal reflux disease without esophagitis: Secondary | ICD-10-CM

## 2019-09-23 DIAGNOSIS — G8921 Chronic pain due to trauma: Secondary | ICD-10-CM

## 2019-09-23 DIAGNOSIS — Z9884 Bariatric surgery status: Secondary | ICD-10-CM

## 2019-09-23 DIAGNOSIS — G629 Polyneuropathy, unspecified: Secondary | ICD-10-CM

## 2019-09-23 DIAGNOSIS — M359 Systemic involvement of connective tissue, unspecified: Secondary | ICD-10-CM

## 2019-09-23 DIAGNOSIS — J449 Chronic obstructive pulmonary disease, unspecified: Secondary | ICD-10-CM

## 2019-09-25 ENCOUNTER — Ambulatory Visit: Admit: 2019-09-25 | Discharge: 2019-09-26 | Payer: MEDICARE

## 2019-09-25 ENCOUNTER — Encounter: Admit: 2019-09-25 | Discharge: 2019-09-25 | Payer: MEDICARE

## 2019-10-06 ENCOUNTER — Encounter: Admit: 2019-10-06 | Discharge: 2019-10-06 | Payer: MEDICARE

## 2019-10-14 ENCOUNTER — Encounter: Admit: 2019-10-14 | Discharge: 2019-10-14 | Payer: MEDICARE

## 2019-10-14 ENCOUNTER — Ambulatory Visit: Admit: 2019-10-14 | Discharge: 2019-10-15 | Payer: MEDICARE

## 2019-10-14 DIAGNOSIS — Z9884 Bariatric surgery status: Secondary | ICD-10-CM

## 2019-10-14 DIAGNOSIS — F419 Anxiety disorder, unspecified: Secondary | ICD-10-CM

## 2019-10-14 DIAGNOSIS — M359 Systemic involvement of connective tissue, unspecified: Secondary | ICD-10-CM

## 2019-10-14 DIAGNOSIS — M5136 Other intervertebral disc degeneration, lumbar region: Secondary | ICD-10-CM

## 2019-10-14 DIAGNOSIS — J302 Other seasonal allergic rhinitis: Secondary | ICD-10-CM

## 2019-10-14 DIAGNOSIS — S99922S Unspecified injury of left foot, sequela: Secondary | ICD-10-CM

## 2019-10-14 DIAGNOSIS — R931 Abnormal findings on diagnostic imaging of heart and coronary circulation: Secondary | ICD-10-CM

## 2019-10-14 DIAGNOSIS — J189 Pneumonia, unspecified organism: Secondary | ICD-10-CM

## 2019-10-14 DIAGNOSIS — G8921 Chronic pain due to trauma: Secondary | ICD-10-CM

## 2019-10-14 DIAGNOSIS — I951 Orthostatic hypotension: Secondary | ICD-10-CM

## 2019-10-14 DIAGNOSIS — G4700 Insomnia, unspecified: Secondary | ICD-10-CM

## 2019-10-14 DIAGNOSIS — M1990 Unspecified osteoarthritis, unspecified site: Secondary | ICD-10-CM

## 2019-10-14 DIAGNOSIS — F329 Major depressive disorder, single episode, unspecified: Secondary | ICD-10-CM

## 2019-10-14 DIAGNOSIS — G629 Polyneuropathy, unspecified: Secondary | ICD-10-CM

## 2019-10-14 DIAGNOSIS — J45909 Unspecified asthma, uncomplicated: Secondary | ICD-10-CM

## 2019-10-14 DIAGNOSIS — G43909 Migraine, unspecified, not intractable, without status migrainosus: Secondary | ICD-10-CM

## 2019-10-14 DIAGNOSIS — S069X9A Unspecified intracranial injury with loss of consciousness of unspecified duration, initial encounter: Secondary | ICD-10-CM

## 2019-10-14 DIAGNOSIS — J449 Chronic obstructive pulmonary disease, unspecified: Secondary | ICD-10-CM

## 2019-10-14 DIAGNOSIS — N200 Calculus of kidney: Secondary | ICD-10-CM

## 2019-10-14 DIAGNOSIS — E610 Copper deficiency: Secondary | ICD-10-CM

## 2019-10-14 DIAGNOSIS — E271 Primary adrenocortical insufficiency: Secondary | ICD-10-CM

## 2019-10-14 DIAGNOSIS — S329XXA Fracture of unspecified parts of lumbosacral spine and pelvis, initial encounter for closed fracture: Secondary | ICD-10-CM

## 2019-10-14 DIAGNOSIS — K219 Gastro-esophageal reflux disease without esophagitis: Secondary | ICD-10-CM

## 2019-10-14 DIAGNOSIS — R1110 Vomiting, unspecified: Secondary | ICD-10-CM

## 2019-10-14 DIAGNOSIS — Z9889 Other specified postprocedural states: Secondary | ICD-10-CM

## 2019-10-14 DIAGNOSIS — J42 Unspecified chronic bronchitis: Secondary | ICD-10-CM

## 2019-10-14 DIAGNOSIS — R0602 Shortness of breath: Secondary | ICD-10-CM

## 2019-10-14 MED ORDER — FLUTICASONE PROPION-SALMETEROL 500-50 MCG/DOSE IN DSDV
1 | Freq: Two times a day (BID) | RESPIRATORY_TRACT | 3 refills | Status: DC
Start: 2019-10-14 — End: 2019-10-22

## 2019-10-14 MED ORDER — MONTELUKAST 10 MG PO TAB
10 mg | ORAL_TABLET | Freq: Every evening | ORAL | 11 refills | 90.00000 days | Status: AC
Start: 2019-10-14 — End: ?

## 2019-10-14 NOTE — Progress Notes
Date of Service: 10/14/2019    Gabriela Hunt is a 53 y.o. female.       HPI     Obtained patient's, or patient proxy's, verbal consent to treat them and their agreement to [facility] financial policy and NPP via this telehealth visit during the [facility]  Patient was evaluated today with the help of a telehealth/Zoom video.    She is a 53 year old female that has a history of chest pain, it was atypical to some extent.  Patient was evaluated with a perfusion imaging study in October 2019, it was not found to be ischemic, left ventricular systolic function was normal.    A 2D echo Doppler study was performed the RV apex was thought to be hypokinetic, patient was referred for further pulmonary evaluation, she was not found to have COPD.  Also a cardiac MRI performed in November 2019 demonstrated normal biventricular function, the RV was mildly dilated with an increased end-diastolic volume which is probably due to this patient's morbid obesity and possible subsequent pulmonary hypertension although the PA pressure on the echocardiogram performed in October 2019 was normal.    She has not had any further symptoms of chest pain.  Her blood pressure is low normal and this has been a long-term finding for this patient.         Vitals:    10/14/19 1102   Weight: 117.9 kg (260 lb)   Height: 1.74 m (5' 8.5)   PainSc: Zero     Body mass index is 38.96 kg/m?Marland Kitchen     Past Medical History  Patient Active Problem List    Diagnosis Date Noted   ? Macromastia 09/18/2019   ? S/P panniculectomy 11/15/2018   ? Pannus, abdominal 10/17/2018   ? Abnormal echocardiogram 10/01/2018   ? Addison's disease (HCC) 07/11/2018   ? Leg edema 07/11/2018   ? History of weight loss surgery 07/11/2018   ? Precordial pain 07/11/2018   ? Preoperative cardiovascular examination 01/03/2018   ? Syncope due to orthostatic hypotension    ? Orthostatic hypotension 07/08/2015   ? Syncope 07/08/2015 02/23/2015 Echo Normal LV size and systolic function EF 60-65 %.  Diastolic relaxation abnormality.  Mild LAE.  Mild aortic regurgitation. Mild mitral regurgitation.  Mild tricuspid regurgitation.     ? Sinus bradycardia 07/08/2015   ? Anxiety 07/08/2015   ? Eczema 07/08/2015   ? Depressive disorder 07/08/2015   ? Asthma 07/08/2015   ? SOB (shortness of breath) 07/08/2015   ? Traumatic brain injury [facility]) 07/08/2015     2011: Larey Seat getting out of shower and hit her head-short term memory.     ? COPD (chronic obstructive pulmonary disease) (HCC) 07/08/2015   ? Falls 07/08/2015     03/26/15             Review of Systems   Constitution: Positive for weight gain.   HENT: Negative.    Eyes: Negative.    Cardiovascular: Positive for dyspnea on exertion, orthopnea and paroxysmal nocturnal dyspnea.   Respiratory: Positive for sleep disturbances due to breathing.    Endocrine: Negative.    Hematologic/Lymphatic: Negative.    Skin: Positive for rash.   Musculoskeletal: Positive for arthritis, back pain, joint pain, muscle weakness, myalgias and neck pain.   Gastrointestinal: Negative.    Genitourinary: Negative.    Neurological: Negative.    Psychiatric/Behavioral: The patient has insomnia.    Allergic/Immunologic: Negative.        Physical Exam  Physical exam could not be performed, this was a video office visit through Telehealth.     Cardiovascular Studies      Problems Addressed Today  Encounter Diagnoses   Name Primary?   ? Orthostatic hypotension Yes   ? SOB (shortness of breath)    ? Traumatic brain injury with loss of consciousness, initial encounter [facility])    ? History of weight loss surgery    ? S/P panniculectomy    ? Abnormal echocardiogram        Assessment and Plan     In summary: This is a 53 year old white female, she remains stable from a cardiac standpoint.  She does not have any symptoms of angina. An abnormal finding on an echocardiogram performed in October 2019 (she was found to have a hypokinetic RV), was further worked up, please see above.    Plan:    1.  I did not add any cardiac medications and I did not make any changes in her complex list of medications  2.  This patient will be seen in our office on an as-needed basis.  If the pulmonary department wants to repeat the echocardiogram we will be happy to perform and review that study.         Current Medications (including today's revisions)  ? acetaminophen (TYLENOL) 500 mg tablet Take two tablets by mouth every 6 hours. Max of 4,000 mg of acetaminophen in 24 hours.    Please take 2 Tablets (1000 mg) every 6 hrs for the first three days after your procedure. After three days, you may take Tylenol as needed.   ? albuterol 0.5% (PROVENTIL) 2.5 mg/0.5 mL nebulizer solution Inhale 2.5 mg solution by nebulizer as directed every 6 hours as needed for Shortness of Breath or Wheezing.   ? albuterol sulfate (PROAIR HFA) 90 mcg/actuation aerosol inhaler Inhale 2 puffs by mouth into the lungs every 6 hours as needed for Wheezing or Shortness of Breath. Shake well before use.   ? aspirin EC 81 mg tablet Take 81 mg by mouth daily.   ? baclofen (LIORESAL) 10 mg tablet Take one tablet by mouth every 6 hours.   ? biotin 1,000 mcg chew Chew  by mouth daily.   ? budesonide-formoteroL (SYMBICORT HFA) 160-4.5 mcg/actuation inhalation Inhale 2 puffs by mouth into the lungs twice daily.   ? calcium carbonate (CALCIUM 600 PO) Take  by mouth daily.   ? cetirizine (ZYRTEC) 10 mg tablet Take 10 mg by mouth daily.   ? cholecalciferol (VITAMIN D-3) 125 mcg (5,000 unit) tablet Take 5,000 Units by mouth daily.   ? cholecalciferol(+) (Vitamin D3) 50,000 units capsule Take 50,000 Units by mouth twice weekly.   ? Copper Gluconate 2 mg tab Take 2 mg by mouth daily.   ? cyanocobalamin (VITAMIN B-12, RUBRAMIN) 1,000 mcg/mL injection Inject 1 mL into the muscle every 30 days. ? diclofenac (VOLTAREN) 1 % topical gel Apply 4 g topically to affected area as Needed.   ? duloxetine DR (CYMBALTA) 30 mg capsule Take 30 mg by mouth twice daily.   ? ELDERBERRY FRUIT PO Take  by mouth daily.   ? ferrous sulfate (FEOSOL) 325 mg (65 mg iron) tablet Take 325 mg by mouth daily. Take on an empty stomach at least 1 hour before or 2 hours after food.   ? fluconazole (DIFLUCAN) 150 mg tablet Take 150 mg by mouth daily.   ? fludrocortisone (FLORINEF) 0.1 mg tablet Take 0.1 mg by mouth daily.   ?  gabapentin (NEURONTIN) 600 mg tablet Take 600 mg by mouth three times daily.   ? loperamide (IMODIUM) 2 mg capsule Take 2 mg by mouth as Needed for Diarrhea.   ? LORazepam (ATIVAN) 0.5 mg tablet Take 1 tablet by mouth every 6 hours as needed for Nausea.   ? Magnesium Glycinate 100 mg tab Take 400 mg by mouth daily.   ? nystatin (NYSTOP) 100,000 unit/g topical powder Apply 1 g topically to affected area as Needed.   ? omeprazole DR(+) (PRILOSEC) 20 mg capsule Take 20 mg by mouth daily.   ? oxyCODONE-acetaminophen(+) (PERCOCET; ENDOCET) 7.5-325 mg tablet Take 1 tablet by mouth three times daily     ? potassium chloride SR (K-DUR) 20 mEq tablet Take 20 mEq by mouth at bedtime daily. Take with a meal and a full glass of water.   ? promethazine (PHENERGAN) 25 mg tablet Take 25 mg by mouth as Needed.   ? RESTASIS 0.05 % ophthalmic emulsion Apply 2 drops to both eyes twice daily.   ? senna (SENOKOT) 8.6 mg tablet Take one tablet by mouth twice daily. Please take while on narcotic pain medication. Please hold for loose stools.   ? sumatriptan succinate (IMITREX) 100 mg tablet Take 100 mg by mouth as Needed for Migraine symptoms. Dose may be repeated in 2 hours if needed. Max of 2 tablets in 24 hours.   ? SUMAtriptan succinate (IMITREX) 50 mg tablet Take 50 mg by mouth twice daily as needed for Migraine symptoms. Dose may be repeated in 2 hours if needed. Max of 2 tablets in 24 hours. ? Syringe (Disposable) 5 mL syrg Use  as directed.   ? topiramate (TOPAMAX) 25 mg tablet Take 25 mg by mouth every 12 hours.   ? vitamins, multiple cap Take 1 capsule by mouth daily.   ? zolpidem CR(+) (AMBIEN CR) 12.5 mg tablet Take 12.5 mg by mouth at bedtime as needed for Sleep. Indications: difficulty sleeping

## 2019-10-14 NOTE — Progress Notes
Did patient read financial policy, consent to treat, and notice of privacy practices? Yes    Does the patient give verbal consent to each policy? Yes    Does the patient have any vitals to report? Yes    Weight Vitals charted in O2? Yes    Is the patient in pain? 0 = No pain    Screening questions completed? Yes    Is the patient able to acces the Mychart message with the start visit link? Yes    Is patient in "virtual waiting room" No

## 2019-10-20 ENCOUNTER — Encounter: Admit: 2019-10-20 | Discharge: 2019-10-20 | Payer: MEDICARE

## 2019-10-20 NOTE — Telephone Encounter
Richardson Dopp, MA,CCC-SLP  You 37 minutes ago (1:49 PM)     Insurance approval is Advair HFA, Earlie Server, and Symbicort. Patient has only attempted Symbicort and was switched on 09/2019 to Advair Diskus.       Routing to Dr.Dwyer to advise.

## 2019-10-20 NOTE — Telephone Encounter
Received fax from [employer] stating Advair Diskus 500/50 needs PA.  E-mailed to Richardson Dopp MA to complete PA.

## 2019-10-22 MED ORDER — ADVAIR HFA 230-21 MCG/ACTUATION IN HFAA
2 | Freq: Two times a day (BID) | RESPIRATORY_TRACT | 5 refills | Status: AC
Start: 2019-10-22 — End: ?

## 2019-10-23 ENCOUNTER — Encounter: Admit: 2019-10-23 | Discharge: 2019-10-23 | Payer: MEDICARE

## 2019-10-23 NOTE — Telephone Encounter
Received call from Abby with [employer] wanting to confirm if PA was being completed or if alternative medication was going to be prescribed.  Spoke with Abby and informed that PA was started today and will follow up once determination has been made by pt's insurance.

## 2019-10-23 NOTE — Telephone Encounter
Prior authorization request received for patient's Advair HFA.  Authorization submitted to [employer] on 10/23/2019.     Key#: [id number]

## 2019-11-10 ENCOUNTER — Encounter: Admit: 2019-11-10 | Discharge: 2019-11-10 | Payer: MEDICARE

## 2019-11-10 NOTE — Telephone Encounter
Reviewed request for cardiac clearance with Dr. Avie Arenas.  Dr. Avie Arenas states pt is okay to proceed with surgery and no additional testing is need at this time.  Will fax clearance to Dr. Mallory Shirk office.

## 2019-11-10 NOTE — Telephone Encounter
Received fax from Dr. Mallory Shirk office requesting cardiac clearance for a left wrist ORIF scheduled for 1/25.      Pt was last seen 12/29 as a telehealth visit with Dr. Avie Arenas, last cardiac testing completed in 2019.    Called pt to discuss.  She denies any cardiac symptoms - denies chest pain, palpitations and shortness of breath.  She states that she is feeling good at this time.    Will route to Dr. Avie Arenas for recommendations.

## 2019-12-16 ENCOUNTER — Encounter: Admit: 2019-12-16 | Discharge: 2019-12-16 | Payer: MEDICARE

## 2019-12-16 NOTE — Telephone Encounter
Called pt to discuss surgery dates, agreed upon 08/17, with pre op 07/29. Pt is aware that ABN will need to be signed at visit.

## 2020-03-23 ENCOUNTER — Encounter: Admit: 2020-03-23 | Discharge: 2020-03-23 | Payer: MEDICARE

## 2020-03-23 ENCOUNTER — Ambulatory Visit: Admit: 2020-03-23 | Discharge: 2020-03-23 | Payer: MEDICARE

## 2020-03-23 DIAGNOSIS — G629 Polyneuropathy, unspecified: Secondary | ICD-10-CM

## 2020-03-23 DIAGNOSIS — M359 Systemic involvement of connective tissue, unspecified: Secondary | ICD-10-CM

## 2020-03-23 DIAGNOSIS — S329XXA Fracture of unspecified parts of lumbosacral spine and pelvis, initial encounter for closed fracture: Secondary | ICD-10-CM

## 2020-03-23 DIAGNOSIS — S99922S Unspecified injury of left foot, sequela: Secondary | ICD-10-CM

## 2020-03-23 DIAGNOSIS — Z9884 Bariatric surgery status: Secondary | ICD-10-CM

## 2020-03-23 DIAGNOSIS — K219 Gastro-esophageal reflux disease without esophagitis: Secondary | ICD-10-CM

## 2020-03-23 DIAGNOSIS — R1110 Vomiting, unspecified: Secondary | ICD-10-CM

## 2020-03-23 DIAGNOSIS — J449 Chronic obstructive pulmonary disease, unspecified: Secondary | ICD-10-CM

## 2020-03-23 DIAGNOSIS — G4700 Insomnia, unspecified: Secondary | ICD-10-CM

## 2020-03-23 DIAGNOSIS — J45909 Unspecified asthma, uncomplicated: Secondary | ICD-10-CM

## 2020-03-23 DIAGNOSIS — J302 Other seasonal allergic rhinitis: Secondary | ICD-10-CM

## 2020-03-23 DIAGNOSIS — S069X9A Unspecified intracranial injury with loss of consciousness of unspecified duration, initial encounter: Secondary | ICD-10-CM

## 2020-03-23 DIAGNOSIS — I951 Orthostatic hypotension: Secondary | ICD-10-CM

## 2020-03-23 DIAGNOSIS — E271 Primary adrenocortical insufficiency: Secondary | ICD-10-CM

## 2020-03-23 DIAGNOSIS — E610 Copper deficiency: Secondary | ICD-10-CM

## 2020-03-23 DIAGNOSIS — M5136 Other intervertebral disc degeneration, lumbar region: Secondary | ICD-10-CM

## 2020-03-23 DIAGNOSIS — J42 Unspecified chronic bronchitis: Secondary | ICD-10-CM

## 2020-03-23 DIAGNOSIS — J4540 Moderate persistent asthma, uncomplicated: Secondary | ICD-10-CM

## 2020-03-23 DIAGNOSIS — G43909 Migraine, unspecified, not intractable, without status migrainosus: Secondary | ICD-10-CM

## 2020-03-23 DIAGNOSIS — F419 Anxiety disorder, unspecified: Secondary | ICD-10-CM

## 2020-03-23 DIAGNOSIS — M1990 Unspecified osteoarthritis, unspecified site: Secondary | ICD-10-CM

## 2020-03-23 DIAGNOSIS — F329 Major depressive disorder, single episode, unspecified: Secondary | ICD-10-CM

## 2020-03-23 DIAGNOSIS — N200 Calculus of kidney: Secondary | ICD-10-CM

## 2020-03-23 DIAGNOSIS — G8921 Chronic pain due to trauma: Secondary | ICD-10-CM

## 2020-03-23 DIAGNOSIS — J189 Pneumonia, unspecified organism: Secondary | ICD-10-CM

## 2020-03-23 NOTE — Progress Notes
Telehealth Visit Note    Date of Service: 03/23/2020    Subjective:      Obtained patient's verbal consent to treat them and their agreement to [facility] financial policy and NPP via this telehealth visit during the [facility]       Gabriela Hunt is a 54 y.o. female.  She is seen via Telehealth today for ongoing management of asthma.    History of Present Illness  Gabriela Hunt is a pleasant 54 yo female who was initially seen in Pulmonary Clinic in February 2020 for COPD.  Her past medical history includes COPD (was told she had this by a Pulmonologist in [place] ~ 10 years ago), asthma, frequent bouts of pneumonia (hospitalized 5 times for this, all in [place]), seasonal allergies (particularly in the Spring/Summer) traumatic brain injury (on disability), orthostatic hypotension, Addison's Disease, bariatric surgery in 2015 (biliopancreatic diversion with duodenal switch) with ~ 250 pound weight loss, anxiety, depression, chronic pain following TBI, cholecystectomy, and hysterectomy.  She is a never smoker and says her COPD was attributed to second hand smoke from her parents.  She has no occupational or environmental exposures.   ?  She denies any history of DVT or PE or ever having been on a ventilator for respiratory issues.  She is a never smoker.  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  She is adopted but has one daughter with asthma and vocal cord dysfunction.  She lives in a condominium with 5 cats.  No birds.     Full PFTs done in February 2020 were normal with a normal DLCO making COPD unlikely.  Given her seasonal allergies we suspected her dyspnea was due to asthma and recommended Symbicort, albuterol prn and ongoing treatment of her allergies with nasal steroids and Zyrtec.    At her last visit in December 2020 she reported ongoing need for albuterol despite the addition of Symbicort.  She stated she had been on Advair in the past which worked well for her so Symbicort was discontinued and Advair 500/50 was initiated.    TODAY:  She reports that her breathing has improved substantially with the initiation of Advair, like night and day.  This despite having contracted Covid in early April.  The illness did not require that she be hospitalized.  She has now been fully vaccinated against Covid-19.  Her PCP has added ipratropium nebs to her regimen which she uses 1-2 times per day and seems to be deriving benefit from it.  She has albuterol on hand but has not needed it since her Covid illness.  She has not required prednisone or any hospitalizations for any asthma related issues.  No nocturnal awakenings.  She continues to be diligent with her Zyrtec, Singulair, and nasal steroids.  She unfortunately still lives with her 5 cats and has absolutely no intention of removing them from the home.   She says August seems to be a bad time for her allergies (goldenrod).    She denies fevers, chills, or sweats.  She rinses her mouth after using the Advair with no thrush.  She lost 25 lbs with her Covid illness and has lost another 10 lbs due to chronic emesis.  She is eating several small meals throughout the day which seems to help.  She does take omeprazole BID and has some GERD.  She is elevating the head of her bed and has adhered to the anti-reflux measures we discussed at her last  visit.    Unfortunately since her last visit she had a fall in her tub which led to a concussion; in addition to her TBI at baseline, she's had more difficulty with concentrating and remembering things.  She also fractured her left wrist with the fall.  She's also been battling kidney stones.     Review of Systems   Per HPI    Objective:         ? acetaminophen (TYLENOL) 500 mg tablet Take two tablets by mouth every 6 hours. Max of 4,000 mg of acetaminophen in 24 hours.    Please take 2 Tablets (1000 mg) every 6 hrs for the first three days after your procedure. After three days, you may take Tylenol as needed.   ? albuterol 0.5% (PROVENTIL) 2.5 mg/0.5 mL nebulizer solution Inhale 2.5 mg solution by nebulizer as directed every 6 hours as needed for Shortness of Breath or Wheezing.   ? albuterol sulfate (PROAIR HFA) 90 mcg/actuation aerosol inhaler Inhale 2 puffs by mouth into the lungs every 6 hours as needed for Wheezing or Shortness of Breath. Shake well before use.   ? aspirin EC 81 mg tablet Take 81 mg by mouth daily.   ? baclofen (LIORESAL) 10 mg tablet Take one tablet by mouth every 6 hours.   ? biotin 1,000 mcg chew Chew  by mouth daily.   ? calcium carbonate (CALCIUM 600 PO) Take  by mouth daily.   ? cetirizine (ZYRTEC) 10 mg tablet Take 10 mg by mouth daily.   ? cholecalciferol (VITAMIN D-3) 125 mcg (5,000 unit) tablet Take 5,000 Units by mouth daily.   ? cholecalciferol(+) (Vitamin D3) 50,000 units capsule Take 50,000 Units by mouth twice weekly.   ? Copper Gluconate 2 mg tab Take 2 mg by mouth daily.   ? cyanocobalamin (VITAMIN B-12, RUBRAMIN) 1,000 mcg/mL injection Inject 1 mL into the muscle every 30 days.   ? diclofenac (VOLTAREN) 1 % topical gel Apply 4 g topically to affected area as Needed.   ? duloxetine DR (CYMBALTA) 30 mg capsule Take 30 mg by mouth twice daily.   ? ELDERBERRY FRUIT PO Take  by mouth daily.   ? ferrous sulfate (FEOSOL) 325 mg (65 mg iron) tablet Take 325 mg by mouth daily. Take on an empty stomach at least 1 hour before or 2 hours after food.   ? fluconazole (DIFLUCAN) 150 mg tablet Take 150 mg by mouth daily.   ? fludrocortisone (FLORINEF) 0.1 mg tablet Take 0.1 mg by mouth daily.   ? fluticasone/salmeterol(+) (ADVAIR HFA) 230/21 mcg inhaler Inhale two puffs by mouth into the lungs every 12 hours.   ? gabapentin (NEURONTIN) 600 mg tablet Take 300 mg by mouth three times daily.   ? loperamide (IMODIUM) 2 mg capsule Take 2 mg by mouth as Needed for Diarrhea.   ? LORazepam (ATIVAN) 0.5 mg tablet Take 1 tablet by mouth every 6 hours as needed for Nausea.   ? Magnesium Glycinate 100 mg tab Take 400 mg by mouth daily.   ? montelukast (SINGULAIR) 10 mg tablet Take one tablet by mouth at bedtime daily.   ? nystatin (NYSTOP) 100,000 unit/g topical powder Apply 1 g topically to affected area as Needed.   ? omeprazole DR(+) (PRILOSEC) 20 mg capsule Take 20 mg by mouth daily.   ? oxyCODONE-acetaminophen(+) (PERCOCET; ENDOCET) 7.5-325 mg tablet Take 1 tablet by mouth three times daily     ? potassium chloride SR (K-DUR) 20 mEq tablet Take  20 mEq by mouth at bedtime daily. Take with a meal and a full glass of water.   ? promethazine (PHENERGAN) 25 mg tablet Take 25 mg by mouth as Needed.   ? RESTASIS 0.05 % ophthalmic emulsion Apply 2 drops to both eyes twice daily.   ? senna (SENOKOT) 8.6 mg tablet Take one tablet by mouth twice daily. Please take while on narcotic pain medication. Please hold for loose stools.   ? sumatriptan succinate (IMITREX) 100 mg tablet Take 100 mg by mouth as Needed for Migraine symptoms. Dose may be repeated in 2 hours if needed. Max of 2 tablets in 24 hours.   ? SUMAtriptan succinate (IMITREX) 50 mg tablet Take 50 mg by mouth twice daily as needed for Migraine symptoms. Dose may be repeated in 2 hours if needed. Max of 2 tablets in 24 hours.   ? Syringe (Disposable) 5 mL syrg Use  as directed.   ? topiramate (TOPAMAX) 25 mg tablet Take 25 mg by mouth every 12 hours.   ? vitamins, multiple cap Take 1 capsule by mouth daily.   ? zolpidem CR(+) (AMBIEN CR) 12.5 mg tablet Take 12.5 mg by mouth at bedtime as needed for Sleep. Indications: difficulty sleeping     There were no vitals filed for this visit.  There is no height or weight on file to calculate BMI.         Physical Exam  Pleasant and talkative  Obese  Talking to me from her bed  No cough  No dyspnea at rest  Despite her claims of forgetfulness, she is a good historian.    Outside record review:  CBC 07/08/19 - normal, no elevated eosinophil count  Chemistry panel and TSH 03/22/17 - normal  CXR 04/2017 - no cardiopulmonary abnormality    2DE 08/08/18:  1. Normal left ventricular systolic function with EF = 60%.  2. Normal chamber sizes.  3. Normal cardiac valve structure and function.  4. Normal pulmonary artery pressure with estimated systolic PAP = .  5. The RV apex appeared to be hypokinetic.   6. Further cardiac imaging could be considered to further elucidate this finding regarding the RV apex.  ?  Full PFTs 12/10/18:  FVC   4.58L   114%  FEV1 3.62L   114%  Ratio 79%  RV 89%  TLC 110%  DLCO 77%  Normal PFTs         Assessment:  Ms. Makris is a 54 yo female with an extensive medical history including asthma, frequent pneumonias (hospitalized 5 times for this in the past, all in [place], none recently), seasonal allergies (particularly in the Spring/Summer) traumatic brain injury (on disability), orthostatic hypotension, Addison's Disease, bariatric surgery in 2015 (biliopancreatic diversion with duodenal switch) with ~ 250 pound weight loss, anxiety, depression, chronic pain following TBI, Covid-19 April in 2021 (was not hospitalized) and nephrolithiasis.  She was referred in February 2020 for evaluation of what was felt to be COPD.  ?  Pulmonary function testing in February 2020 was normal.  Given lack of airflow obstruction, normal DLCO, and never smoker status, we doubted COPD and if anything suspected she had asthma.  She was given Symbicort but continued to have frequent need for albuterol rescue.  In December 2020, we switched her to Advair which she had good success with in the past and added Singulair for her seasonal allergies.  Since then, her asthma has been much better controlled.  She has not used her albuterol inhaler  in 2 months.  Weight +/- deconditioning likely also plays a role in her exertional dyspnea.      Recommendations:  - continue Advair 500/50 BID with continued mouth rinsing after ICS use  - continue Singulair to help with allergies  - continue Zyrtec and nasal steroids for seasonal allergies  - OK to continue ipratropium nebs prescribed by her PCP  - advised her to remove her cats from the home but she still is unwilling to do so  - for GERD:   - continue PPI BID   - continue to elevate HOB 30 degrees   - advised no meals after 6 pm   - discussed avoidance of foods known to aggravate GERD  - Given recent concussion and baseline TBI, wishes to follow up via telehealth for her next appointment   - at next in person visit, will repeat office spirometry and obtain baseline CXR at [place]; note prior CXR report from [facility] was unremarkable      45 minutes total time spent on this encounter.

## 2020-03-23 NOTE — Patient Instructions
It was a pleasure to see you today.  We would like to see you in 6 months.    My nurse is Clemens Catholic, Charity fundraiser.  She can be reached at [phone].    Please contact my nurse with any signs and symptoms of worsening productive cough with thick secretions, blood in sputum, chest pain or tightness, shortness of breath, fever, chills, night sweats, or any questions or concerns.    For refills on medications, please have your pharmacy request a refill authorization either electronically or via fax to our office at [phone]. Please allow at least 3 business days for refill requests.    For urgent issues after business hours, weekends, or holidays please call [phone] and request for the pulmonary fellow to be paged.    If you need to cancel or reschedule an appointment, please call [phone].

## 2020-04-09 ENCOUNTER — Encounter: Admit: 2020-04-09 | Discharge: 2020-04-09 | Payer: MEDICARE

## 2020-04-09 ENCOUNTER — Ambulatory Visit: Admit: 2020-04-09 | Discharge: 2020-04-09 | Payer: MEDICARE

## 2020-04-09 DIAGNOSIS — N62 Hypertrophy of breast: Secondary | ICD-10-CM

## 2020-05-13 ENCOUNTER — Encounter: Admit: 2020-05-13 | Discharge: 2020-05-13 | Payer: MEDICARE

## 2020-05-13 ENCOUNTER — Ambulatory Visit: Admit: 2020-05-13 | Discharge: 2020-05-14 | Payer: MEDICARE

## 2020-05-13 DIAGNOSIS — G629 Polyneuropathy, unspecified: Secondary | ICD-10-CM

## 2020-05-13 DIAGNOSIS — G43909 Migraine, unspecified, not intractable, without status migrainosus: Secondary | ICD-10-CM

## 2020-05-13 DIAGNOSIS — S99922S Unspecified injury of left foot, sequela: Secondary | ICD-10-CM

## 2020-05-13 DIAGNOSIS — F329 Major depressive disorder, single episode, unspecified: Secondary | ICD-10-CM

## 2020-05-13 DIAGNOSIS — M1990 Unspecified osteoarthritis, unspecified site: Secondary | ICD-10-CM

## 2020-05-13 DIAGNOSIS — K219 Gastro-esophageal reflux disease without esophagitis: Secondary | ICD-10-CM

## 2020-05-13 DIAGNOSIS — Z9884 Bariatric surgery status: Secondary | ICD-10-CM

## 2020-05-13 DIAGNOSIS — M359 Systemic involvement of connective tissue, unspecified: Secondary | ICD-10-CM

## 2020-05-13 DIAGNOSIS — S329XXA Fracture of unspecified parts of lumbosacral spine and pelvis, initial encounter for closed fracture: Secondary | ICD-10-CM

## 2020-05-13 DIAGNOSIS — E610 Copper deficiency: Secondary | ICD-10-CM

## 2020-05-13 DIAGNOSIS — N200 Calculus of kidney: Secondary | ICD-10-CM

## 2020-05-13 DIAGNOSIS — G4700 Insomnia, unspecified: Secondary | ICD-10-CM

## 2020-05-13 DIAGNOSIS — I951 Orthostatic hypotension: Secondary | ICD-10-CM

## 2020-05-13 DIAGNOSIS — J302 Other seasonal allergic rhinitis: Secondary | ICD-10-CM

## 2020-05-13 DIAGNOSIS — R1110 Vomiting, unspecified: Secondary | ICD-10-CM

## 2020-05-13 DIAGNOSIS — J449 Chronic obstructive pulmonary disease, unspecified: Secondary | ICD-10-CM

## 2020-05-13 DIAGNOSIS — M5136 Other intervertebral disc degeneration, lumbar region: Secondary | ICD-10-CM

## 2020-05-13 DIAGNOSIS — S069X9A Unspecified intracranial injury with loss of consciousness of unspecified duration, initial encounter: Secondary | ICD-10-CM

## 2020-05-13 DIAGNOSIS — J42 Unspecified chronic bronchitis: Secondary | ICD-10-CM

## 2020-05-13 DIAGNOSIS — E271 Primary adrenocortical insufficiency: Secondary | ICD-10-CM

## 2020-05-13 DIAGNOSIS — J45909 Unspecified asthma, uncomplicated: Secondary | ICD-10-CM

## 2020-05-13 DIAGNOSIS — F419 Anxiety disorder, unspecified: Secondary | ICD-10-CM

## 2020-05-13 DIAGNOSIS — G8921 Chronic pain due to trauma: Secondary | ICD-10-CM

## 2020-05-13 DIAGNOSIS — J189 Pneumonia, unspecified organism: Secondary | ICD-10-CM

## 2020-05-20 ENCOUNTER — Encounter: Admit: 2020-05-20 | Discharge: 2020-05-20 | Payer: MEDICARE

## 2020-05-20 ENCOUNTER — Ambulatory Visit: Admit: 2020-05-20 | Discharge: 2020-05-21 | Payer: MEDICARE

## 2020-05-20 DIAGNOSIS — F329 Major depressive disorder, single episode, unspecified: Secondary | ICD-10-CM

## 2020-05-20 DIAGNOSIS — I951 Orthostatic hypotension: Secondary | ICD-10-CM

## 2020-05-20 DIAGNOSIS — Z9884 Bariatric surgery status: Secondary | ICD-10-CM

## 2020-05-20 DIAGNOSIS — M1990 Unspecified osteoarthritis, unspecified site: Secondary | ICD-10-CM

## 2020-05-20 DIAGNOSIS — G629 Polyneuropathy, unspecified: Secondary | ICD-10-CM

## 2020-05-20 DIAGNOSIS — G8921 Chronic pain due to trauma: Secondary | ICD-10-CM

## 2020-05-20 DIAGNOSIS — G4700 Insomnia, unspecified: Secondary | ICD-10-CM

## 2020-05-20 DIAGNOSIS — S069X9A Unspecified intracranial injury with loss of consciousness of unspecified duration, initial encounter: Secondary | ICD-10-CM

## 2020-05-20 DIAGNOSIS — F419 Anxiety disorder, unspecified: Secondary | ICD-10-CM

## 2020-05-20 DIAGNOSIS — J42 Unspecified chronic bronchitis: Secondary | ICD-10-CM

## 2020-05-20 DIAGNOSIS — G43909 Migraine, unspecified, not intractable, without status migrainosus: Secondary | ICD-10-CM

## 2020-05-20 DIAGNOSIS — J302 Other seasonal allergic rhinitis: Secondary | ICD-10-CM

## 2020-05-20 DIAGNOSIS — J449 Chronic obstructive pulmonary disease, unspecified: Secondary | ICD-10-CM

## 2020-05-20 DIAGNOSIS — J45909 Unspecified asthma, uncomplicated: Secondary | ICD-10-CM

## 2020-05-20 DIAGNOSIS — M359 Systemic involvement of connective tissue, unspecified: Secondary | ICD-10-CM

## 2020-05-20 DIAGNOSIS — J189 Pneumonia, unspecified organism: Secondary | ICD-10-CM

## 2020-05-20 DIAGNOSIS — E610 Copper deficiency: Secondary | ICD-10-CM

## 2020-05-20 DIAGNOSIS — R1110 Vomiting, unspecified: Secondary | ICD-10-CM

## 2020-05-20 DIAGNOSIS — M5136 Other intervertebral disc degeneration, lumbar region: Secondary | ICD-10-CM

## 2020-05-20 DIAGNOSIS — K219 Gastro-esophageal reflux disease without esophagitis: Secondary | ICD-10-CM

## 2020-05-20 DIAGNOSIS — S329XXA Fracture of unspecified parts of lumbosacral spine and pelvis, initial encounter for closed fracture: Secondary | ICD-10-CM

## 2020-05-20 DIAGNOSIS — S99922S Unspecified injury of left foot, sequela: Secondary | ICD-10-CM

## 2020-05-20 DIAGNOSIS — N200 Calculus of kidney: Secondary | ICD-10-CM

## 2020-05-20 NOTE — Pre-Anesthesia Patient Instructions
 GENERAL INFORMATION    Before you coe to the hospital  ? Make arrangeents for a responsible adult to drive you hoe and stay with you for 24 hours following surgery.  ? Bath/Shower Instructions  ? Take a bath or shower with antibacterial soap the night before or the orning of your procedure. Use clean towels.  ? Put on clean clothes after bath or shower.  Avoid using lotion and oils.  ? If you are having surgery above the waist, wear a shirt that fastens up the front.  ? Sleep on clean sheets if bath or shower is done the night before procedure.  ? Leave oney, credit cards, jewelry, and any other valuables at hoe. The [facility] is not responsible for the loss or breakage of personal ites.  ? Reove nail polish, akeup and all jewelry (including piercings) before coing to the hospital.  ? The orning of your procedure:  ? brush your teeth and tongue  ? do not soke  ? do not shave the area where you will have surgery    What to bring to the hospital  ? ID/ Insurance Card  ? Medical Device card  ? Official docuents for legal guardianship   ? Copy of your Living Will, Advanced Directives, and/or Durable Power of Attorney   ? Sall bag with a few personal belongings  ? CPAP/BiPAP achine (including all supplies)  ? Walker,cane, or otorized scooter  ? Cases for glasses/hearing aids/contact lens (bring solutions for contacts)  ? Dress in clean, loose, cofortable clothing     Eating or drinking before surgery  ? Do not eat or drink anything after 11:00 p.. the day before your procedure (including gu, ints, candy, or chewing tobacco) OR follow the specific instructions you were given by your Surgeon.  ? You ay have WATER ONLY up to 2 hours before arriving at the hospital.       Other instructions  Notify your surgeon if:  ? there is a possibility that you are pregnant  ? you becoe ill with a cough, fever, sore throat, nausea, voiting or flu-like syptos  ? you have any open wounds/sores that are red, painful, draining, or are new since you last saw  the doctor  ? you need to cancel your procedure  ? You will receive a call with your surgery arrival tie fro between 2:30p and 4:30p the last business day before your procedure.  If you do not receive a call, please call [phone] before 4:30p or [phone] after 4:30p.    Notify us at [facility]: [phone]  ? if you need to cancel your procedure  ? if you are going to be late    Arrival at the hospital    [facility]  [address]  [place], [place] [address]    ? Park in the [employer], located directly across fro the ain entrance to the hospital.  ? [place]ee [place] parking is available  fro 7 AM to 4 PM Monday through Friday.  ? Enter through the ground floor ain hospital entrance and check in at the Inforation Desk in the lobby.  ? They will validate your parking ticket and direct you to the next location.  ? If you are a woan between the ages of 35 and 26, and have not had a hysterectoy, you will be asked for a urine saple prior to surgery.  Please do not urinate before arriving in the Surgery Waiting  Roo.  Once there, check in and let the attendant know if you need to provide a saple.      If you are having an outpatient procedure, you will need to arrange for a responsible ride/person to accopany you hoe due to sedation or anesthesia with your procedure. A responsible person is a person who has the ability to identify a change in the patient's status and notify edical personnel.  This is typically a faily eber or friend.  Public transportation is peritted if you have a responsible person to accopany you.  An Benedetto Goad, taxi or other public transportation driver is not considered a responsible person to accopany you hoe.    For the safety of all patients, visitors and staff as we work to contain COVID-19, we ust restrict patient visitors.    Current Visitor Policy (04/07/20):    Our current, and ongoing, visitor rules in surgery and procedural areas are:    1 visitor per patient will be allowed to accopany the patient and wait in the Waiting Roo  No visitors will be allowed into the pre/post areas       Patients in inpatient and pediatric units, Eergency Departent, abulatory clinics and lab appointents ay now have two visitors at the sae tie.  For inpatient stays, there is no liit on the aount of visitors per day but only two visitors ay visit at a tie.  The policy applies to [facility] [place] [facility] [place], [address], Radio producer and [place] capuses and clinics.      Exceptions include:  No visitors allowed for patients with active COVID-19 infections.  No visitors under the age of 81 for inpatients.  Parents or guardians ay bring their children to their clinic appointents or bring siblings to their children's clinic appointent.  One visitor allowed during a patient?s cancer exa appointent; however, no visitors allowed during their cancer treatent/infusion appointent; check with staff for exceptions.  One visitor allowed in perioperative and procedural waiting roos; no visitors allowed in pre/post areas unless a patient becoes an overnight boarder.  Two parents/guardians are allowed for surgical or procedural patients younger than 53 years old.  Adult inpatients in seiprivate roos ay have visitors, but visits should be coordinated so only two total visitors are in a roo at a tie due to space liitations.    Visitors will continue to be screened at all entrances. They ust be free of fever and syptos to be in our facilities. We ask visitors to follow these guidelines:  Wear a ask at all ties, unless under the age of 2, have trouble breathing or are unconscious, incapacitated or otherwise unable to reove the cover without assistance.  Go directly to the nursing station in the unit you are visiting and do not linger in public areas.  Check in at the nursing station before going to the patient's roo.  Maintain a physical distance of six feet fro all others.  Follow elevator restrictions to four riding at a tie - peak ties are 6:30-7:30 a.., noon and 6:30-7:30 p..  Be aware cafeteria peak ties are 11 a.. - 1 p..  Wash your hands frequently and cover your coughs and sneezes.    Coronavirus (COVID19) Inforation  If you get sick with fever (100.21F/38C or higher), cough, or have trouble breathing:  ? Call your priary care physician for questions or health needs.  ? Tell your doctor about any recent travel and your syptos.  ? Check your  MyChart for your COVID swab results. (MyChart notifications are immediate and patients are often know their test result before their surgeon is notified).  ? Notify your surgeon if you are COVID+ positive.  ? If you are COVID+ positive DO NOT come to the hospital for your surgery until your surgeon has instructed you on what to do. Wait for instructions to find out if you should stay home or if you should still have surgery.  ? Avoid contact with others.    For up to date information on the Coronavirus, visit the CDC website at DiningCalendar.de.

## 2020-05-20 NOTE — Progress Notes
PAC Antiplatelet Plan Note:  Gabriela Hunt was seen in the [facility] telehealth on 05/20/20.  As part of the visit, an accurate medication list was obtained and the patient was given pre-op medication instructions for upcoming surgery on 06/01/20.      Per Dr Magnus Ivan, the patient should hold Aspirin 81mg  for 7 days prior to surgery, with the last dose prior to surgery on 05/24/20.    The plan above was communicated to the patient and they verbalized understanding.    Janine Ores, PHARMD

## 2020-05-20 NOTE — Progress Notes
A telemedicine encounter was created for the patient.  The patient stated he received the financial policy, consent to treat and notice of privacy practice via my chart.  Patient consents to the telehealth visit.  Nursing and surgery history were obtained.  Patient was given surgery and soap instructions and instructions were sent via My Chart.  COVID19 visitor policy was reviewed.  Patient verbalized understanding.

## 2020-05-21 DIAGNOSIS — Z01818 Encounter for other preprocedural examination: Secondary | ICD-10-CM

## 2020-06-01 ENCOUNTER — Encounter: Admit: 2020-06-01 | Discharge: 2020-06-01 | Payer: MEDICARE

## 2020-06-01 ENCOUNTER — Ambulatory Visit: Admit: 2020-06-01 | Discharge: 2020-06-01 | Payer: MEDICARE

## 2020-06-01 DIAGNOSIS — M5136 Other intervertebral disc degeneration, lumbar region: Secondary | ICD-10-CM

## 2020-06-01 DIAGNOSIS — I951 Orthostatic hypotension: Secondary | ICD-10-CM

## 2020-06-01 DIAGNOSIS — E610 Copper deficiency: Secondary | ICD-10-CM

## 2020-06-01 DIAGNOSIS — R1110 Vomiting, unspecified: Secondary | ICD-10-CM

## 2020-06-01 DIAGNOSIS — G43909 Migraine, unspecified, not intractable, without status migrainosus: Secondary | ICD-10-CM

## 2020-06-01 DIAGNOSIS — F329 Major depressive disorder, single episode, unspecified: Secondary | ICD-10-CM

## 2020-06-01 DIAGNOSIS — G8921 Chronic pain due to trauma: Secondary | ICD-10-CM

## 2020-06-01 DIAGNOSIS — G4700 Insomnia, unspecified: Secondary | ICD-10-CM

## 2020-06-01 DIAGNOSIS — G629 Polyneuropathy, unspecified: Secondary | ICD-10-CM

## 2020-06-01 DIAGNOSIS — S069X9A Unspecified intracranial injury with loss of consciousness of unspecified duration, initial encounter: Secondary | ICD-10-CM

## 2020-06-01 DIAGNOSIS — J42 Unspecified chronic bronchitis: Secondary | ICD-10-CM

## 2020-06-01 DIAGNOSIS — K219 Gastro-esophageal reflux disease without esophagitis: Secondary | ICD-10-CM

## 2020-06-01 DIAGNOSIS — N200 Calculus of kidney: Secondary | ICD-10-CM

## 2020-06-01 DIAGNOSIS — J189 Pneumonia, unspecified organism: Secondary | ICD-10-CM

## 2020-06-01 DIAGNOSIS — S329XXA Fracture of unspecified parts of lumbosacral spine and pelvis, initial encounter for closed fracture: Secondary | ICD-10-CM

## 2020-06-01 DIAGNOSIS — J302 Other seasonal allergic rhinitis: Secondary | ICD-10-CM

## 2020-06-01 DIAGNOSIS — J449 Chronic obstructive pulmonary disease, unspecified: Secondary | ICD-10-CM

## 2020-06-01 DIAGNOSIS — M1990 Unspecified osteoarthritis, unspecified site: Secondary | ICD-10-CM

## 2020-06-01 DIAGNOSIS — S99922S Unspecified injury of left foot, sequela: Secondary | ICD-10-CM

## 2020-06-01 DIAGNOSIS — M359 Systemic involvement of connective tissue, unspecified: Secondary | ICD-10-CM

## 2020-06-01 DIAGNOSIS — Z9884 Bariatric surgery status: Secondary | ICD-10-CM

## 2020-06-01 DIAGNOSIS — F419 Anxiety disorder, unspecified: Secondary | ICD-10-CM

## 2020-06-01 DIAGNOSIS — J45909 Unspecified asthma, uncomplicated: Secondary | ICD-10-CM

## 2020-06-01 MED ORDER — MIDAZOLAM 1 MG/ML IJ SOLN
INTRAVENOUS | 0 refills | Status: DC
Start: 2020-06-01 — End: 2020-06-01

## 2020-06-01 MED ORDER — PHENYLEPHRINE 80MCG/ML IN NS IV DRIP (DBL CONC)
INTRAVENOUS | 0 refills | Status: DC
Start: 2020-06-01 — End: 2020-06-01

## 2020-06-01 MED ORDER — SUGAMMADEX 100 MG/ML IV SOLN
INTRAVENOUS | 0 refills | Status: DC
Start: 2020-06-01 — End: 2020-06-01

## 2020-06-01 MED ORDER — ROCURONIUM 10 MG/ML IV SOLN
INTRAVENOUS | 0 refills | Status: DC
Start: 2020-06-01 — End: 2020-06-01

## 2020-06-01 MED ORDER — REMIFENTANYL 1000MCG IN NS 20ML (OR)
INTRAVENOUS | 0 refills | Status: DC
Start: 2020-06-01 — End: 2020-06-01

## 2020-06-01 MED ORDER — PROPOFOL 10 MG/ML IV EMUL 100 ML (INFUSION)(AM)(OR)
INTRAVENOUS | 0 refills | Status: DC
Start: 2020-06-01 — End: 2020-06-01

## 2020-06-01 MED ORDER — KETAMINE 10 MG/ML IJ SOLN
INTRAVENOUS | 0 refills | Status: DC
Start: 2020-06-01 — End: 2020-06-01

## 2020-06-01 MED ORDER — FENTANYL CITRATE (PF) 50 MCG/ML IJ SOLN
INTRAVENOUS | 0 refills | Status: DC
Start: 2020-06-01 — End: 2020-06-01

## 2020-06-01 MED ORDER — LIDOCAINE (PF) 20 MG/ML (2 %) IJ SOLN
INTRAVENOUS | 0 refills | Status: DC
Start: 2020-06-01 — End: 2020-06-01

## 2020-06-01 MED ORDER — CEFAZOLIN 1 GRAM IJ SOLR
INTRAVENOUS | 0 refills | Status: DC
Start: 2020-06-01 — End: 2020-06-01

## 2020-06-01 MED ORDER — PROPOFOL INJ 10 MG/ML IV VIAL
INTRAVENOUS | 0 refills | Status: DC
Start: 2020-06-01 — End: 2020-06-01

## 2020-06-01 MED ORDER — ONDANSETRON HCL (PF) 4 MG/2 ML IJ SOLN
INTRAVENOUS | 0 refills | Status: DC
Start: 2020-06-01 — End: 2020-06-01

## 2020-06-01 MED ORDER — HYDROCORTISONE SOD SUCC (PF) 100 MG/2 ML IJ SOLR
INTRAVENOUS | 0 refills | Status: DC
Start: 2020-06-01 — End: 2020-06-01

## 2020-06-01 MED ORDER — ACETAMINOPHEN 1,000 MG/100 ML (10 MG/ML) IV SOLN
INTRAVENOUS | 0 refills | Status: DC
Start: 2020-06-01 — End: 2020-06-01

## 2020-06-01 MED FILL — DOXYCYCLINE HYCLATE 100 MG PO CAP: 100 mg | ORAL | 7 days supply | Qty: 14 | Fill #1 | Status: CP

## 2020-06-02 ENCOUNTER — Encounter: Admit: 2020-06-02 | Discharge: 2020-06-02 | Payer: MEDICARE

## 2020-06-02 NOTE — Telephone Encounter
Received patient call stating she had bleeding from incision and was having pain s/p bilateral breast reduction.  Patient states she woke up and her nightgown had blood on it.  Drains still producing appropriate drainage.  States she is having pain, did not pick up prescribed pain medication due to having a pain contract with another provider.  Offered for patient to come to clinic to change dressing as needed, patient declined.  Educated she could reinforce dressings with gauze and tape.  Notified patient she should reach out to pain specialist to ensure she can take additional pain medication, stated she has done that and they are ok with it.  New prescription for pain medication sent to preferred pharmacy. Encouraged patient to reach out with additional questions or concerns.

## 2020-06-03 ENCOUNTER — Encounter: Admit: 2020-06-03 | Discharge: 2020-06-03 | Payer: MEDICARE

## 2020-06-03 NOTE — Telephone Encounter
Pt's caregiver with pt.  Called to report small amount of bleeding noted on her dressing -right breast the bleeding is off to the side of nipple.  Left side, small amount of bleeding over night along underneath.  I told them these are common areas.  The bleeding should slow.  She reports the drains are putting out about 10cc at a time. No other concerns. Pt to follow up as planned.

## 2020-06-07 ENCOUNTER — Encounter: Admit: 2020-06-07 | Discharge: 2020-06-07 | Payer: MEDICARE

## 2020-06-08 ENCOUNTER — Encounter: Admit: 2020-06-08 | Discharge: 2020-06-08 | Payer: MEDICARE

## 2020-06-08 MED ORDER — TRAMADOL 50 MG PO TAB
50 mg | ORAL_TABLET | ORAL | 0 refills | Status: AC
Start: 2020-06-08 — End: ?

## 2020-06-08 NOTE — Telephone Encounter
Pt called stating oxycodone does not resolve pain, has also been taking Tylenol in between to relieve the pain. Per pt, tramadol ends to help. Per Dr. Fran Lowes, okay with Rx of #30 Tramadol, Rx sent to local pharmacy.

## 2020-06-10 ENCOUNTER — Encounter: Admit: 2020-06-10 | Discharge: 2020-06-10 | Payer: MEDICARE

## 2020-06-10 ENCOUNTER — Ambulatory Visit: Admit: 2020-06-10 | Discharge: 2020-06-11 | Payer: MEDICARE

## 2020-06-10 DIAGNOSIS — R1110 Vomiting, unspecified: Secondary | ICD-10-CM

## 2020-06-10 DIAGNOSIS — Z9884 Bariatric surgery status: Secondary | ICD-10-CM

## 2020-06-10 DIAGNOSIS — G43909 Migraine, unspecified, not intractable, without status migrainosus: Secondary | ICD-10-CM

## 2020-06-10 DIAGNOSIS — M359 Systemic involvement of connective tissue, unspecified: Secondary | ICD-10-CM

## 2020-06-10 DIAGNOSIS — N62 Hypertrophy of breast: Secondary | ICD-10-CM

## 2020-06-10 DIAGNOSIS — F329 Major depressive disorder, single episode, unspecified: Secondary | ICD-10-CM

## 2020-06-10 DIAGNOSIS — E610 Copper deficiency: Secondary | ICD-10-CM

## 2020-06-10 DIAGNOSIS — J449 Chronic obstructive pulmonary disease, unspecified: Secondary | ICD-10-CM

## 2020-06-10 DIAGNOSIS — M5136 Other intervertebral disc degeneration, lumbar region: Secondary | ICD-10-CM

## 2020-06-10 DIAGNOSIS — G4700 Insomnia, unspecified: Secondary | ICD-10-CM

## 2020-06-10 DIAGNOSIS — J42 Unspecified chronic bronchitis: Secondary | ICD-10-CM

## 2020-06-10 DIAGNOSIS — S99922S Unspecified injury of left foot, sequela: Secondary | ICD-10-CM

## 2020-06-10 DIAGNOSIS — J302 Other seasonal allergic rhinitis: Secondary | ICD-10-CM

## 2020-06-10 DIAGNOSIS — N200 Calculus of kidney: Secondary | ICD-10-CM

## 2020-06-10 DIAGNOSIS — J189 Pneumonia, unspecified organism: Secondary | ICD-10-CM

## 2020-06-10 DIAGNOSIS — K219 Gastro-esophageal reflux disease without esophagitis: Secondary | ICD-10-CM

## 2020-06-10 DIAGNOSIS — S069X9A Unspecified intracranial injury with loss of consciousness of unspecified duration, initial encounter: Secondary | ICD-10-CM

## 2020-06-10 DIAGNOSIS — J45909 Unspecified asthma, uncomplicated: Secondary | ICD-10-CM

## 2020-06-10 DIAGNOSIS — I951 Orthostatic hypotension: Secondary | ICD-10-CM

## 2020-06-10 DIAGNOSIS — G8921 Chronic pain due to trauma: Secondary | ICD-10-CM

## 2020-06-10 DIAGNOSIS — F419 Anxiety disorder, unspecified: Secondary | ICD-10-CM

## 2020-06-10 DIAGNOSIS — S329XXA Fracture of unspecified parts of lumbosacral spine and pelvis, initial encounter for closed fracture: Secondary | ICD-10-CM

## 2020-06-10 DIAGNOSIS — M1990 Unspecified osteoarthritis, unspecified site: Secondary | ICD-10-CM

## 2020-06-10 DIAGNOSIS — G629 Polyneuropathy, unspecified: Secondary | ICD-10-CM

## 2020-06-10 MED ORDER — OXYCODONE 5 MG PO TAB
5 mg | ORAL_TABLET | ORAL | 0 refills | 6.00000 days | Status: AC | PRN
Start: 2020-06-10 — End: ?

## 2020-06-10 MED ORDER — TRAMADOL 50 MG PO TAB
50 mg | ORAL_TABLET | ORAL | 0 refills | Status: AC
Start: 2020-06-10 — End: ?

## 2020-06-10 NOTE — Progress Notes
Subjective:       History of Present Illness  Gabriela Hunt is a 54 y.o. female. Patient presents for postop. Bolsters and therabond/tegaderm dressings in place. Patient continues to have bilateral breast pain.   Allergies   Allergen Reactions   ? Adhesive Tape (Rosins) RASH     grid tape; Paper Tape is tolerated        Review of Systems   Constitutional: Negative.    HENT: Negative.    Eyes: Negative.    Respiratory: Negative.    Cardiovascular: Negative.    Gastrointestinal: Negative.    Endocrine: Negative.    Genitourinary: Negative.    Musculoskeletal: Negative.    Skin: Negative.    Neurological: Negative.    Hematological: Negative.    Psychiatric/Behavioral: Negative.      Medical History:   Diagnosis Date   ? Anxiety    ? Arthritis    ? Asthma    ? Autoimmune disease (HCC)    ? Chronic bronchitis (HCC)    ? Chronic pain after traumatic injury    ? 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 COPD (chronic obstructive pulmonary disease) (HCC)     second hand smoke exposure from parents.   ? Copper deficiency    ? DDD (degenerative disc disease), lumbar    ? Depression    ? Foot injury, left, sequela    ? GERD (gastroesophageal reflux disease)    ? Insomnia    ? Kidney stone    ? Migraine    ? Neuropathy     feet, hands, and legs   ? Pelvic fracture (HCC)    ? Pneumonia    ? Seasonal allergic reaction    ? Status post biliopancreatic diversion with duodenal switch    ? Syncope due to orthostatic hypotension    ? TBI (traumatic brain injury) (HCC)     with short term memory loss, hard to focus, left little finger is numb.     Surgical History:   Procedure Laterality Date   ? EXCISION EXCESSIVE SKIN/ SUBCUTANEOUS TISSUE - ABDOMEN WITH INFRAUMBILICAL PANNICULECTOMY, UMBILECTOMY, APPLICATION NEGATIVE PRESSURE WOUND THERAPY N/A 11/15/2018    Performed by Ursula Beath, MD at [facility] OR   ? MAMMAPLASTY REDUCTION Bilateral 06/01/2020    Performed by Marlinda Mike, MD at [facility] OR   ? RECONSTRUCTION NIPPLE/ AREOLA Bilateral 06/01/2020    Performed by Marlinda Mike, MD at [facility] OR   ? BARIATRIC SURGERY     ? CESAREAN SECTION, CLASSIC     ? CHOLECYSTECTOMY     ? ENDOMETRIAL ABLATION      times 3   ? HYSTERECTOMY     ? KNEE ARTHROPLASTY Bilateral    ? OTHER SURGICAL HISTORY      as per PMH/problem list   ? TONSILLECTOMY       No family history on file.  Social History     Socioeconomic History   ? Marital status: Single     Spouse name: Not on file   ? Number of children: 1   ? Years of education: Not on file   ? Highest education level: Not on file   Occupational History   ? Not on file   Tobacco Use   ? Smoking status: Never Smoker   ? Smokeless tobacco: Never Used   Substance and Sexual Activity   ? Alcohol use: Yes     Comment: socially    ?  Drug use: Never   ? Sexual activity: Not on file   Other Topics Concern   ? Not on file   Social History Narrative    Lives alone, not working, on disability from chronic pain and TBI     Objective:         ? acetaminophen (TYLENOL) 500 mg tablet Take two tablets by mouth every 6 hours. Max of 4,000 mg of acetaminophen in 24 hours.    Please take 2 Tablets (1000 mg) every 6 hrs for the first three days after your procedure. After three days, you may take Tylenol as needed.   ? albuterol 0.5% (PROVENTIL) 2.5 mg/0.5 mL nebulizer solution Inhale 2.5 mg solution by nebulizer as directed every 6 hours as needed for Shortness of Breath or Wheezing.   ? albuterol sulfate (PROAIR HFA) 90 mcg/actuation HFA aerosol inhaler Inhale 2 puffs by mouth into the lungs every 6 hours as needed for Wheezing or Shortness of Breath. Shake well before use.    ? aspirin EC 81 mg tablet Take 81 mg by mouth daily.   ? baclofen (LIORESAL) 10 mg tablet Take one tablet by mouth every 6 hours.   ? biotin 1,000 mcg chew Chew 1 tablet by mouth daily.   ? calcium carbonate (CALCIUM 600 PO) Take 1 tablet by mouth daily.   ? cetirizine (ZYRTEC) 10 mg tablet Take 10 mg by mouth daily.   ? cholecalciferol(+) (Vitamin D3) 50,000 units capsule Take 50,000 Units by mouth twice weekly.   ? Copper Gluconate 2 mg tab Take 2 mg by mouth daily.   ? cyanocobalamin (VITAMIN B-12, RUBRAMIN) 1,000 mcg/mL injection Inject 1 mL into the muscle every 30 days.   ? duloxetine DR (CYMBALTA) 30 mg capsule Take 30 mg by mouth twice daily.   ? ELDERBERRY FRUIT PO Take 1 capsule by mouth twice daily. 2000mg    ? ferrous sulfate (FEOSOL) 325 mg (65 mg iron) tablet Take 325 mg by mouth daily. Take on an empty stomach at least 1 hour before or 2 hours after food.   ? fluconazole (DIFLUCAN) 150 mg tablet Take 150 mg by mouth daily as needed (for topical yeast infection).   ? fludrocortisone (FLORINEF) 0.1 mg tablet Take 0.1 mg by mouth daily.   ? fluticasone/salmeterol(+) (ADVAIR HFA) 230/21 mcg inhaler Inhale two puffs by mouth into the lungs every 12 hours.   ? gabapentin (NEURONTIN) 300 mg capsule Take 300 mg by mouth three times daily.   ? loperamide (IMODIUM) 2 mg capsule Take 2 mg by mouth as Needed for Diarrhea.   ? LORazepam (ATIVAN) 0.5 mg tablet Take 1 tablet by mouth every 6 hours as needed for Nausea.   ? Magnesium Glycinate 100 mg tab Take 400 mg by mouth daily.   ? montelukast (SINGULAIR) 10 mg tablet Take one tablet by mouth at bedtime daily.   ? nystatin (MYCOSTATIN) 100,000 unit/g topical cream Apply  topically to affected area twice daily as needed (for topical yeast infection).   ? nystatin (NYSTOP) 100,000 unit/g topical powder Apply 1 g topically to affected area as Needed.   ? omeprazole DR(+) (PRILOSEC) 20 mg capsule Take 20 mg by mouth daily.   ? oxyCODONE (ROXICODONE) 5 mg tablet Take one tablet by mouth every 6 hours as needed for Pain   ? oxyCODONE-acetaminophen(+) (PERCOCET; ENDOCET) 7.5-325 mg tablet Take 1 tablet by mouth three times daily     ? promethazine (PHENERGAN) 25 mg tablet Take 25 mg by mouth  as Needed.   ? sumatriptan succinate (IMITREX) 100 mg tablet Take 100 mg by mouth as Needed for Migraine symptoms. Dose may be repeated in 2 hours if needed. Max of 2 tablets in 24 hours.    ? SUMAtriptan succinate (IMITREX) 50 mg tablet Take 50 mg by mouth as Needed for Migraine symptoms. Take one tablet by mouth at onset of headache. May repeat after 2 hours if needed. Max of 200 mg in 24 hours.   ? Syringe (Disposable) 5 mL syrg Use  as directed.   ? topiramate (TOPAMAX) 25 mg tablet Take 25 mg by mouth every 12 hours.   ? traMADoL (ULTRAM) 50 mg tablet Take one tablet by mouth every 6 hours.   ? vitamins, multiple cap Take 1 capsule by mouth daily.   ? zolpidem CR(+) (AMBIEN CR) 12.5 mg tablet Take 12.5 mg by mouth at bedtime as needed for Sleep. Indications: difficulty sleeping     Vitals:    06/10/20 0956   BP: 122/88   BP Source: Arm, Left Lower   Patient Position: Sitting   Pulse: 87   Temp: 36.2 ?C (97.2 ?F)   TempSrc: Skin   Weight: 103.9 kg (229 lb)   Height: 174 cm (68.5)   PainSc: Seven     Body mass index is 34.31 kg/m?Marland Kitchen     Physical Exam  Vitals reviewed.   Constitutional:       Appearance: Normal appearance. She is well-groomed.   HENT:      Head: Normocephalic.   Pulmonary:      Effort: Pulmonary effort is normal.   Chest:      Comments: Bilateral nipple grafts healing appropriately.   Skin:     General: Skin is warm.      Findings: Bruising present.   Neurological:      Mental Status: She is alert.   Psychiatric:         Mood and Affect: Mood normal.         Behavior: Behavior normal. Behavior is cooperative.         Thought Content: Thought content normal.         Judgment: Judgment normal.          Assessment and Plan:  Patient s/p bilateral mammaplasty reduction.   Bilateral breast JP drains removed.  Refill of oxycodone and tramadol for pain relief.  RTC in one month.

## 2020-06-14 ENCOUNTER — Encounter: Admit: 2020-06-14 | Discharge: 2020-06-14 | Payer: MEDICARE

## 2020-06-28 ENCOUNTER — Encounter: Admit: 2020-06-28 | Discharge: 2020-06-28 | Payer: MEDICARE

## 2020-06-29 ENCOUNTER — Encounter: Admit: 2020-06-29 | Discharge: 2020-06-29 | Payer: MEDICARE

## 2020-06-30 ENCOUNTER — Encounter: Admit: 2020-06-30 | Discharge: 2020-06-30 | Payer: MEDICARE

## 2020-06-30 MED ORDER — SULFAMETHOXAZOLE-TRIMETHOPRIM 800-160 MG PO TAB
1 | ORAL_TABLET | Freq: Two times a day (BID) | ORAL | 0 refills | Status: AC
Start: 2020-06-30 — End: ?

## 2020-07-03 ENCOUNTER — Encounter: Admit: 2020-07-03 | Discharge: 2020-07-03 | Payer: MEDICARE

## 2020-07-07 ENCOUNTER — Encounter: Admit: 2020-07-07 | Discharge: 2020-07-07 | Payer: MEDICARE

## 2020-07-07 ENCOUNTER — Ambulatory Visit: Admit: 2020-07-07 | Discharge: 2020-07-07 | Payer: MEDICARE

## 2020-07-07 DIAGNOSIS — G629 Polyneuropathy, unspecified: Secondary | ICD-10-CM

## 2020-07-07 DIAGNOSIS — N200 Calculus of kidney: Secondary | ICD-10-CM

## 2020-07-07 DIAGNOSIS — E610 Copper deficiency: Secondary | ICD-10-CM

## 2020-07-07 DIAGNOSIS — J189 Pneumonia, unspecified organism: Secondary | ICD-10-CM

## 2020-07-07 DIAGNOSIS — G4700 Insomnia, unspecified: Secondary | ICD-10-CM

## 2020-07-07 DIAGNOSIS — S99922S Unspecified injury of left foot, sequela: Secondary | ICD-10-CM

## 2020-07-07 DIAGNOSIS — M1990 Unspecified osteoarthritis, unspecified site: Secondary | ICD-10-CM

## 2020-07-07 DIAGNOSIS — J302 Other seasonal allergic rhinitis: Secondary | ICD-10-CM

## 2020-07-07 DIAGNOSIS — Z9884 Bariatric surgery status: Secondary | ICD-10-CM

## 2020-07-07 DIAGNOSIS — R1110 Vomiting, unspecified: Secondary | ICD-10-CM

## 2020-07-07 DIAGNOSIS — J45909 Unspecified asthma, uncomplicated: Secondary | ICD-10-CM

## 2020-07-07 DIAGNOSIS — M5136 Other intervertebral disc degeneration, lumbar region: Secondary | ICD-10-CM

## 2020-07-07 DIAGNOSIS — G8921 Chronic pain due to trauma: Secondary | ICD-10-CM

## 2020-07-07 DIAGNOSIS — F419 Anxiety disorder, unspecified: Secondary | ICD-10-CM

## 2020-07-07 DIAGNOSIS — S069X9A Unspecified intracranial injury with loss of consciousness of unspecified duration, initial encounter: Secondary | ICD-10-CM

## 2020-07-07 DIAGNOSIS — N62 Hypertrophy of breast: Secondary | ICD-10-CM

## 2020-07-07 DIAGNOSIS — M359 Systemic involvement of connective tissue, unspecified: Secondary | ICD-10-CM

## 2020-07-07 DIAGNOSIS — I951 Orthostatic hypotension: Secondary | ICD-10-CM

## 2020-07-07 DIAGNOSIS — K219 Gastro-esophageal reflux disease without esophagitis: Secondary | ICD-10-CM

## 2020-07-07 DIAGNOSIS — J42 Unspecified chronic bronchitis: Secondary | ICD-10-CM

## 2020-07-07 DIAGNOSIS — S329XXA Fracture of unspecified parts of lumbosacral spine and pelvis, initial encounter for closed fracture: Secondary | ICD-10-CM

## 2020-07-07 DIAGNOSIS — F329 Major depressive disorder, single episode, unspecified: Secondary | ICD-10-CM

## 2020-07-07 DIAGNOSIS — J449 Chronic obstructive pulmonary disease, unspecified: Secondary | ICD-10-CM

## 2020-07-07 DIAGNOSIS — G43909 Migraine, unspecified, not intractable, without status migrainosus: Secondary | ICD-10-CM

## 2020-07-07 NOTE — Progress Notes
Subjective:       History of Present Illness  Gabriela Hunt is a 54 y.o. female. Patient returns to clinic for bilateral breast wound evaluation. Patient states she has been applying telfa to bilateral NAC and T-intersection. Patient has two sutures on the lateral aspects of her chest. No other concerns.  Allergies   Allergen Reactions   ? Adhesive Tape (Rosins) RASH     grid tape; Paper Tape is tolerated          Review of Systems   Constitutional: Negative.    HENT: Negative.    Eyes: Negative.    Respiratory: Negative.    Cardiovascular: Negative.    Gastrointestinal: Negative.    Endocrine: Negative.    Genitourinary: Negative.    Musculoskeletal: Positive for back pain.   Skin: Negative.    Allergic/Immunologic: Negative.    Neurological: Negative.    Hematological: Negative.    Psychiatric/Behavioral: Negative.      Medical History:   Diagnosis Date   ? Anxiety    ? Arthritis    ? Asthma    ? Autoimmune disease (HCC)    ? Chronic bronchitis (HCC)    ? Chronic pain after traumatic injury    ? 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 COPD (chronic obstructive pulmonary disease) (HCC)     second hand smoke exposure from parents.   ? Copper deficiency    ? DDD (degenerative disc disease), lumbar    ? Depression    ? Foot injury, left, sequela    ? GERD (gastroesophageal reflux disease)    ? Insomnia    ? Kidney stone    ? Migraine    ? Neuropathy     feet, hands, and legs   ? Pelvic fracture (HCC)    ? Pneumonia    ? Seasonal allergic reaction    ? Status post biliopancreatic diversion with duodenal switch    ? Syncope due to orthostatic hypotension    ? TBI (traumatic brain injury) (HCC)     with short term memory loss, hard to focus, left little finger is numb.     Surgical History:   Procedure Laterality Date   ? EXCISION EXCESSIVE SKIN/ SUBCUTANEOUS TISSUE - ABDOMEN WITH INFRAUMBILICAL PANNICULECTOMY, UMBILECTOMY, APPLICATION NEGATIVE PRESSURE WOUND THERAPY N/A 11/15/2018    Performed by Ursula Beath, MD at [facility] OR   ? MAMMAPLASTY REDUCTION Bilateral 06/01/2020    Performed by Marlinda Mike, MD at [facility] OR   ? RECONSTRUCTION NIPPLE/ AREOLA Bilateral 06/01/2020    Performed by Marlinda Mike, MD at [facility] OR   ? BARIATRIC SURGERY     ? CESAREAN SECTION, CLASSIC     ? CHOLECYSTECTOMY     ? ENDOMETRIAL ABLATION      times 3   ? HYSTERECTOMY     ? KNEE ARTHROPLASTY Bilateral    ? OTHER SURGICAL HISTORY      as per PMH/problem list   ? TONSILLECTOMY       No family history on file.  Social History     Socioeconomic History   ? Marital status: Single     Spouse name: Not on file   ? Number of children: 1   ? Years of education: Not on file   ? Highest education level: Not on file   Occupational History   ? Not on file   Tobacco Use   ? Smoking status: Never Smoker   ?  Smokeless tobacco: Never Used   Substance and Sexual Activity   ? Alcohol use: Yes     Comment: socially    ? Drug use: Never   ? Sexual activity: Not on file   Other Topics Concern   ? Not on file   Social History Narrative    Lives alone, not working, on disability from chronic pain and TBI     Objective:         ? acetaminophen (TYLENOL) 500 mg tablet Take two tablets by mouth every 6 hours. Max of 4,000 mg of acetaminophen in 24 hours.    Please take 2 Tablets (1000 mg) every 6 hrs for the first three days after your procedure. After three days, you may take Tylenol as needed.   ? albuterol 0.5% (PROVENTIL) 2.5 mg/0.5 mL nebulizer solution Inhale 2.5 mg solution by nebulizer as directed every 6 hours as needed for Shortness of Breath or Wheezing.   ? albuterol sulfate (PROAIR HFA) 90 mcg/actuation HFA aerosol inhaler Inhale 2 puffs by mouth into the lungs every 6 hours as needed for Wheezing or Shortness of Breath. Shake well before use.    ? aspirin EC 81 mg tablet Take 81 mg by mouth daily.   ? baclofen (LIORESAL) 10 mg tablet Take one tablet by mouth every 6 hours.   ? biotin 1,000 mcg chew Chew 1 tablet by mouth daily.   ? calcium carbonate (CALCIUM 600 PO) Take 1 tablet by mouth daily.   ? cetirizine (ZYRTEC) 10 mg tablet Take 10 mg by mouth daily.   ? cholecalciferol(+) (Vitamin D3) 50,000 units capsule Take 50,000 Units by mouth twice weekly.   ? Copper Gluconate 2 mg tab Take 2 mg by mouth daily.   ? cyanocobalamin (VITAMIN B-12, RUBRAMIN) 1,000 mcg/mL injection Inject 1 mL into the muscle every 30 days.   ? duloxetine DR (CYMBALTA) 30 mg capsule Take 30 mg by mouth twice daily.   ? ELDERBERRY FRUIT PO Take 1 capsule by mouth twice daily. 2000mg    ? ferrous sulfate (FEOSOL) 325 mg (65 mg iron) tablet Take 325 mg by mouth daily. Take on an empty stomach at least 1 hour before or 2 hours after food.   ? fluconazole (DIFLUCAN) 150 mg tablet Take 150 mg by mouth daily as needed (for topical yeast infection).   ? fludrocortisone (FLORINEF) 0.1 mg tablet Take 0.1 mg by mouth daily.   ? fluticasone/salmeterol(+) (ADVAIR HFA) 230/21 mcg inhaler Inhale two puffs by mouth into the lungs every 12 hours.   ? gabapentin (NEURONTIN) 300 mg capsule Take 300 mg by mouth three times daily.   ? loperamide (IMODIUM) 2 mg capsule Take 2 mg by mouth as Needed for Diarrhea.   ? LORazepam (ATIVAN) 0.5 mg tablet Take 1 tablet by mouth every 6 hours as needed for Nausea.   ? Magnesium Glycinate 100 mg tab Take 400 mg by mouth daily.   ? montelukast (SINGULAIR) 10 mg tablet Take one tablet by mouth at bedtime daily.   ? nystatin (MYCOSTATIN) 100,000 unit/g topical cream Apply  topically to affected area twice daily as needed (for topical yeast infection).   ? nystatin (NYSTOP) 100,000 unit/g topical powder Apply 1 g topically to affected area as Needed.   ? omeprazole DR(+) (PRILOSEC) 20 mg capsule Take 20 mg by mouth daily.   ? oxyCODONE-acetaminophen(+) (PERCOCET; ENDOCET) 7.5-325 mg tablet Take 1 tablet by mouth three times daily     ? promethazine (PHENERGAN) 25 mg  tablet Take 25 mg by mouth as Needed.   ? sumatriptan succinate (IMITREX) 100 mg tablet Take 100 mg by mouth as Needed for Migraine symptoms. Dose may be repeated in 2 hours if needed. Max of 2 tablets in 24 hours.    ? SUMAtriptan succinate (IMITREX) 50 mg tablet Take 50 mg by mouth as Needed for Migraine symptoms. Take one tablet by mouth at onset of headache. May repeat after 2 hours if needed. Max of 200 mg in 24 hours.   ? Syringe (Disposable) 5 mL syrg Use  as directed.   ? topiramate (TOPAMAX) 25 mg tablet Take 25 mg by mouth every 12 hours.   ? vitamins, multiple cap Take 1 capsule by mouth daily.   ? zolpidem CR(+) (AMBIEN CR) 12.5 mg tablet Take 12.5 mg by mouth at bedtime as needed for Sleep. Indications: difficulty sleeping     Vitals:    07/07/20 1430   BP: 98/65   Pulse: 101   Weight: 101.2 kg (223 lb)   Height: 172.7 cm (68)   PainSc: Six     Body mass index is 33.91 kg/m?Marland Kitchen     Physical Exam  Vitals reviewed.   Constitutional:       Appearance: Normal appearance. She is well-groomed.   HENT:      Head: Normocephalic.   Pulmonary:      Effort: Pulmonary effort is normal.   Chest:      Comments: Bilateral superficial breakdown of T-intersection right side greater than left side. Spitting suture lateral aspects of chest.  Skin:     General: Skin is warm and dry.   Neurological:      Mental Status: She is alert.   Psychiatric:         Mood and Affect: Mood normal.         Behavior: Behavior normal. Behavior is cooperative.         Thought Content: Thought content normal.         Judgment: Judgment normal.          Assessment and Plan:  Patient s/p bilateral mammaplasty reduction   Apply small amount of prisma to right inferior breast breakdown.  Patient may follow-up telehealth due to difficulty with transportation.

## 2020-12-14 ENCOUNTER — Encounter: Admit: 2020-12-14 | Discharge: 2020-12-14 | Payer: MEDICARE

## 2020-12-14 MED ORDER — ADVAIR HFA 230-21 MCG/ACTUATION IN HFAA
5 refills
Start: 2020-12-14 — End: ?

## 2020-12-17 ENCOUNTER — Encounter: Admit: 2020-12-17 | Discharge: 2020-12-17 | Payer: MEDICARE

## 2020-12-17 MED ORDER — ADVAIR HFA 230-21 MCG/ACTUATION IN HFAA
2 refills | Status: AC
Start: 2020-12-17 — End: ?

## 2021-01-18 ENCOUNTER — Encounter: Admit: 2021-01-18 | Discharge: 2021-01-18 | Payer: MEDICARE

## 2021-02-07 ENCOUNTER — Encounter: Admit: 2021-02-07 | Discharge: 2021-02-07 | Payer: MEDICARE

## 2021-02-23 ENCOUNTER — Encounter: Admit: 2021-02-23 | Discharge: 2021-02-23 | Payer: MEDICARE

## 2021-02-23 ENCOUNTER — Ambulatory Visit: Admit: 2021-02-23 | Discharge: 2021-02-23 | Payer: MEDICARE

## 2021-02-23 DIAGNOSIS — M1990 Unspecified osteoarthritis, unspecified site: Secondary | ICD-10-CM

## 2021-02-23 DIAGNOSIS — K219 Gastro-esophageal reflux disease without esophagitis: Secondary | ICD-10-CM

## 2021-02-23 DIAGNOSIS — J45909 Unspecified asthma, uncomplicated: Secondary | ICD-10-CM

## 2021-02-23 DIAGNOSIS — N200 Calculus of kidney: Secondary | ICD-10-CM

## 2021-02-23 DIAGNOSIS — G43909 Migraine, unspecified, not intractable, without status migrainosus: Secondary | ICD-10-CM

## 2021-02-23 DIAGNOSIS — R1110 Vomiting, unspecified: Secondary | ICD-10-CM

## 2021-02-23 DIAGNOSIS — F32A Depression: Secondary | ICD-10-CM

## 2021-02-23 DIAGNOSIS — E610 Copper deficiency: Secondary | ICD-10-CM

## 2021-02-23 DIAGNOSIS — S99922S Unspecified injury of left foot, sequela: Secondary | ICD-10-CM

## 2021-02-23 DIAGNOSIS — J42 Unspecified chronic bronchitis: Secondary | ICD-10-CM

## 2021-02-23 DIAGNOSIS — S329XXA Fracture of unspecified parts of lumbosacral spine and pelvis, initial encounter for closed fracture: Secondary | ICD-10-CM

## 2021-02-23 DIAGNOSIS — I951 Orthostatic hypotension: Secondary | ICD-10-CM

## 2021-02-23 DIAGNOSIS — J302 Other seasonal allergic rhinitis: Secondary | ICD-10-CM

## 2021-02-23 DIAGNOSIS — S069X9A Unspecified intracranial injury with loss of consciousness of unspecified duration, initial encounter: Secondary | ICD-10-CM

## 2021-02-23 DIAGNOSIS — Z9884 Bariatric surgery status: Secondary | ICD-10-CM

## 2021-02-23 DIAGNOSIS — J449 Chronic obstructive pulmonary disease, unspecified: Secondary | ICD-10-CM

## 2021-02-23 DIAGNOSIS — R300 Dysuria: Secondary | ICD-10-CM

## 2021-02-23 DIAGNOSIS — F419 Anxiety disorder, unspecified: Secondary | ICD-10-CM

## 2021-02-23 DIAGNOSIS — G8921 Chronic pain due to trauma: Secondary | ICD-10-CM

## 2021-02-23 DIAGNOSIS — M359 Systemic involvement of connective tissue, unspecified: Secondary | ICD-10-CM

## 2021-02-23 DIAGNOSIS — J189 Pneumonia, unspecified organism: Secondary | ICD-10-CM

## 2021-02-23 DIAGNOSIS — G4700 Insomnia, unspecified: Secondary | ICD-10-CM

## 2021-02-23 DIAGNOSIS — M5136 Other intervertebral disc degeneration, lumbar region: Secondary | ICD-10-CM

## 2021-02-23 DIAGNOSIS — G629 Polyneuropathy, unspecified: Secondary | ICD-10-CM

## 2021-02-23 MED ORDER — TRAMADOL 50 MG PO TAB
50 mg | ORAL_TABLET | ORAL | 0 refills | Status: AC | PRN
Start: 2021-02-23 — End: ?

## 2021-02-23 MED ORDER — CEFAZOLIN INJ 1GM IVP
2 g | Freq: Once | INTRAVENOUS | 0 refills
Start: 2021-02-23 — End: ?

## 2021-03-09 IMAGING — CR LOW_EXM
3 series · 3 of 3 positions shown · non-contrast
Comparison: none

[ankle ap]
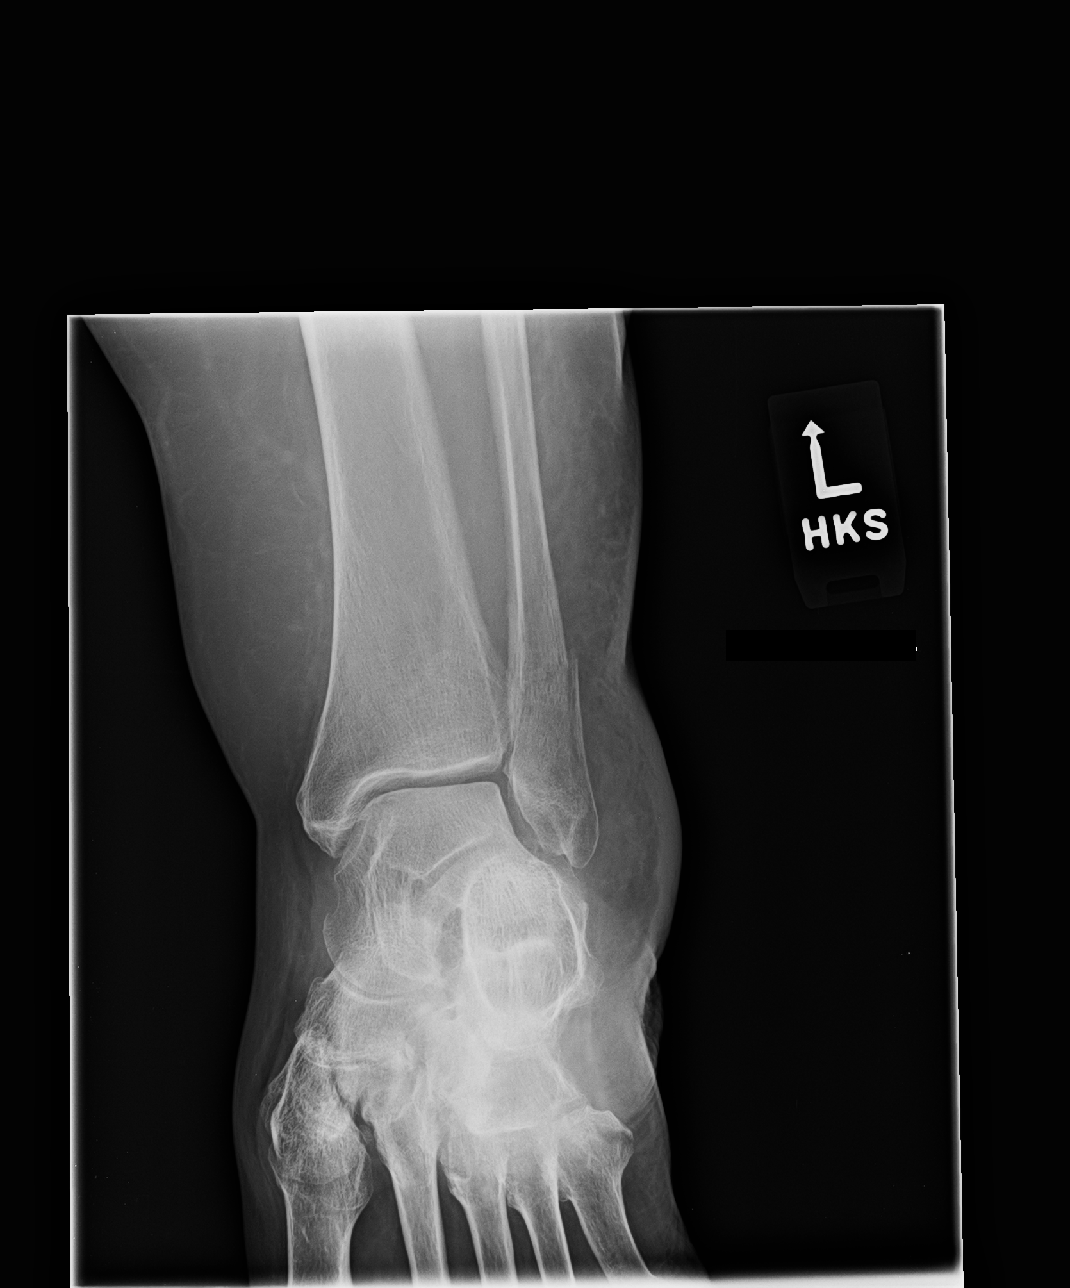

[ankle obl]
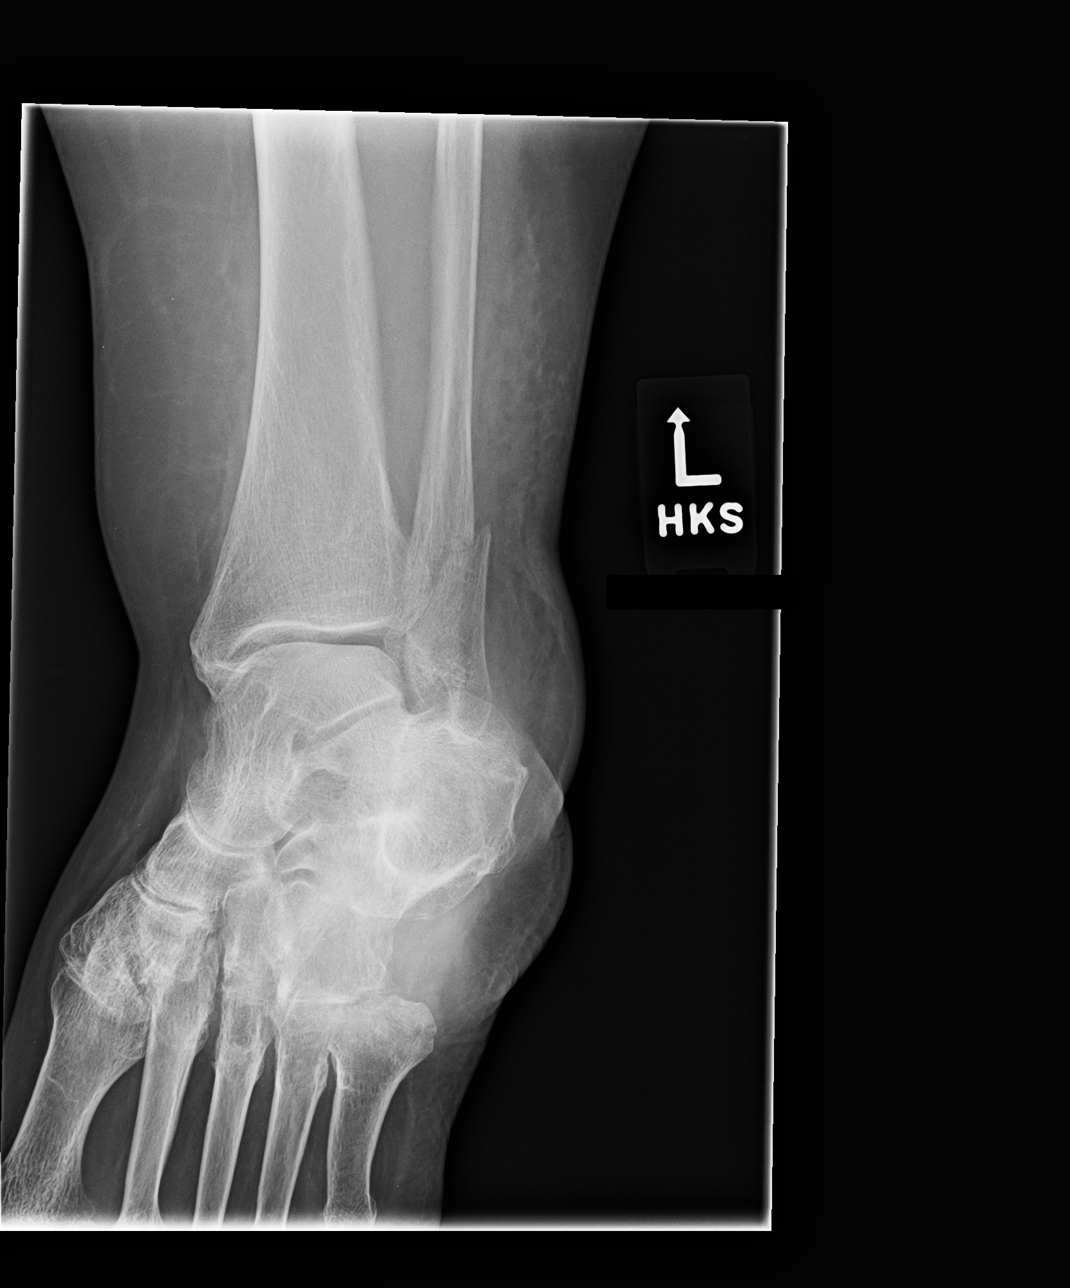

[ankle lat]
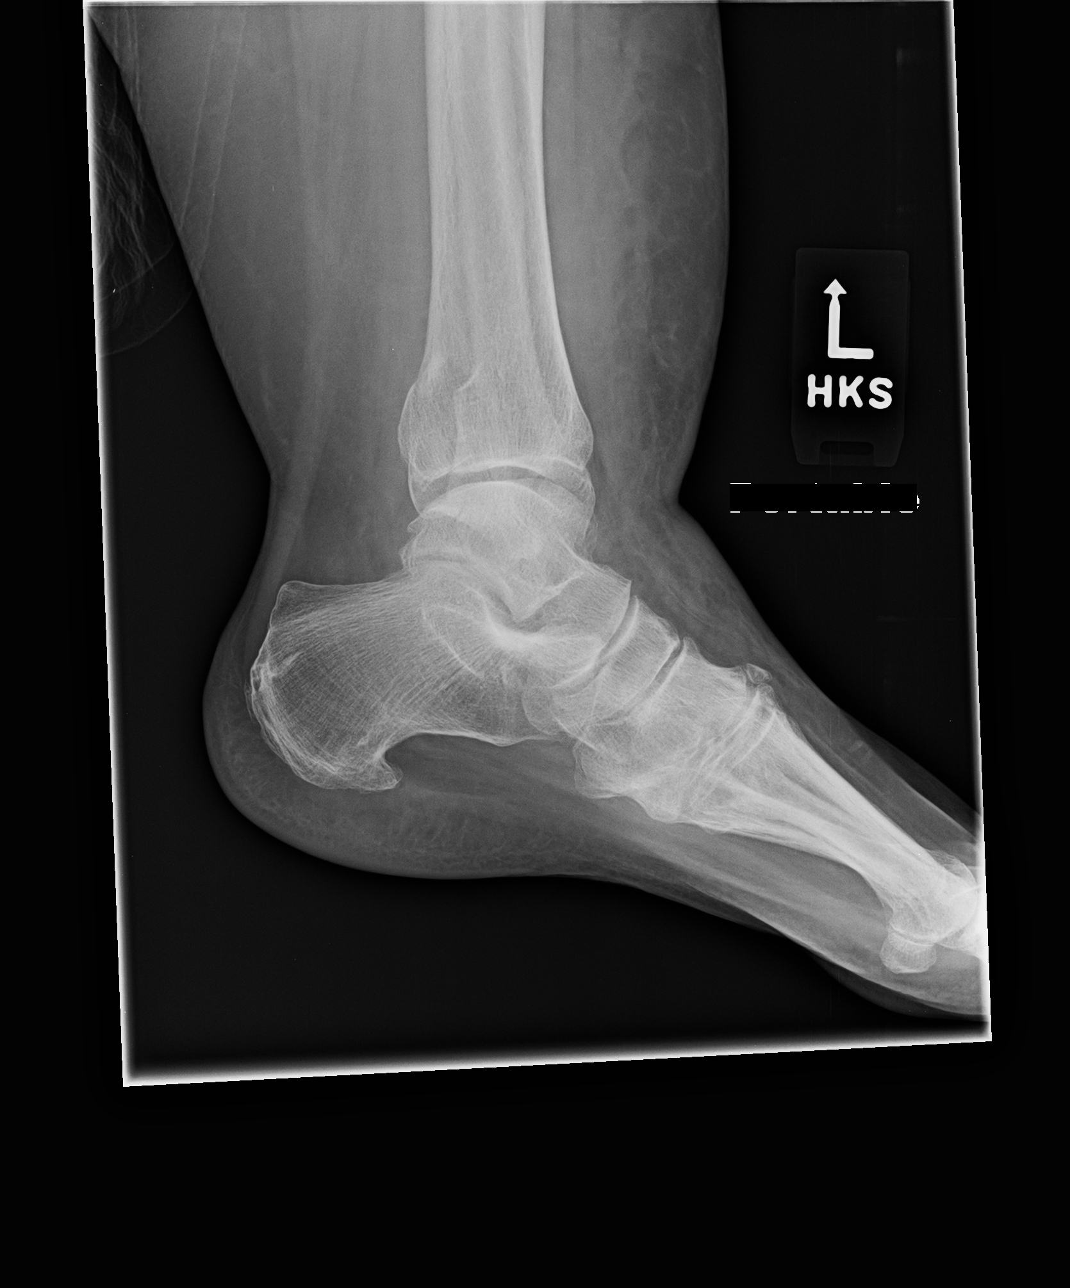

[3 of 3 positions shown; findings below may reference images not displayed]

EXAM

XR ankle LT min 3V

INDICATION

ANKLE PAIN
PT STATES SHE THINKS SHE FELL, C/O LATERAL AND ANTERIOR FOOT PAIN W/SWELLING. HS/HB

TECHNIQUE

XR ankle LT min 3V

COMPARISONS

None available

FINDINGS

Mildly displaced Weber B fibular fracture. Distal fracture fragment demonstrates mild posterior
displacement. Expected overlying soft tissue swelling. Poorly defined posterior malleolar fracture.

IMPRESSION

Mildly displaced fibular Weber B fracture and posterior malleolar fracture.

Tech Notes:

PT STATES SHE THINKS SHE FELL, C/O LATERAL AND ANTERIOR FOOT PAIN W/SWELLING. HS/HB

## 2021-03-16 ENCOUNTER — Encounter: Admit: 2021-03-16 | Discharge: 2021-03-16 | Payer: MEDICARE

## 2021-03-16 NOTE — Telephone Encounter
Tammy with [facility] in [place] calling regarding a PA needed for patient's upcoming CT scan. Informed her that we dont do the PA for outside facilities. She informed me they will sent it to their precert department but need most recent OV note sent to attn: Jami at [phone]

## 2021-03-22 ENCOUNTER — Encounter: Admit: 2021-03-22 | Discharge: 2021-03-22 | Payer: MEDICARE

## 2021-03-29 ENCOUNTER — Encounter: Admit: 2021-03-29 | Discharge: 2021-03-29 | Payer: MEDICARE

## 2021-03-29 DIAGNOSIS — M1990 Unspecified osteoarthritis, unspecified site: Secondary | ICD-10-CM

## 2021-03-29 DIAGNOSIS — K219 Gastro-esophageal reflux disease without esophagitis: Secondary | ICD-10-CM

## 2021-03-29 DIAGNOSIS — J45909 Unspecified asthma, uncomplicated: Secondary | ICD-10-CM

## 2021-03-29 DIAGNOSIS — S069X9A Unspecified intracranial injury with loss of consciousness of unspecified duration, initial encounter: Secondary | ICD-10-CM

## 2021-03-29 DIAGNOSIS — S99922S Unspecified injury of left foot, sequela: Secondary | ICD-10-CM

## 2021-03-29 DIAGNOSIS — Z9884 Bariatric surgery status: Secondary | ICD-10-CM

## 2021-03-29 DIAGNOSIS — E610 Copper deficiency: Secondary | ICD-10-CM

## 2021-03-29 DIAGNOSIS — G8921 Chronic pain due to trauma: Secondary | ICD-10-CM

## 2021-03-29 DIAGNOSIS — J189 Pneumonia, unspecified organism: Secondary | ICD-10-CM

## 2021-03-29 DIAGNOSIS — F32A Depression: Secondary | ICD-10-CM

## 2021-03-29 DIAGNOSIS — S329XXA Fracture of unspecified parts of lumbosacral spine and pelvis, initial encounter for closed fracture: Secondary | ICD-10-CM

## 2021-03-29 DIAGNOSIS — F419 Anxiety disorder, unspecified: Secondary | ICD-10-CM

## 2021-03-29 DIAGNOSIS — M5136 Other intervertebral disc degeneration, lumbar region: Secondary | ICD-10-CM

## 2021-03-29 DIAGNOSIS — R1110 Vomiting, unspecified: Secondary | ICD-10-CM

## 2021-03-29 DIAGNOSIS — J449 Chronic obstructive pulmonary disease, unspecified: Secondary | ICD-10-CM

## 2021-03-29 DIAGNOSIS — G629 Polyneuropathy, unspecified: Secondary | ICD-10-CM

## 2021-03-29 DIAGNOSIS — N200 Calculus of kidney: Secondary | ICD-10-CM

## 2021-03-29 DIAGNOSIS — M359 Systemic involvement of connective tissue, unspecified: Secondary | ICD-10-CM

## 2021-03-29 DIAGNOSIS — G43909 Migraine, unspecified, not intractable, without status migrainosus: Secondary | ICD-10-CM

## 2021-03-29 DIAGNOSIS — J42 Unspecified chronic bronchitis: Secondary | ICD-10-CM

## 2021-03-29 DIAGNOSIS — G4700 Insomnia, unspecified: Secondary | ICD-10-CM

## 2021-03-29 DIAGNOSIS — I951 Orthostatic hypotension: Secondary | ICD-10-CM

## 2021-03-29 DIAGNOSIS — J302 Other seasonal allergic rhinitis: Secondary | ICD-10-CM

## 2021-04-05 ENCOUNTER — Ambulatory Visit: Admit: 2021-04-05 | Discharge: 2021-04-05 | Payer: MEDICARE

## 2021-04-05 ENCOUNTER — Encounter: Admit: 2021-04-05 | Discharge: 2021-04-05 | Payer: MEDICARE

## 2021-04-05 DIAGNOSIS — Z9884 Bariatric surgery status: Secondary | ICD-10-CM

## 2021-04-05 DIAGNOSIS — J42 Unspecified chronic bronchitis: Secondary | ICD-10-CM

## 2021-04-05 DIAGNOSIS — J302 Other seasonal allergic rhinitis: Secondary | ICD-10-CM

## 2021-04-05 DIAGNOSIS — E610 Copper deficiency: Secondary | ICD-10-CM

## 2021-04-05 DIAGNOSIS — S069X9A Unspecified intracranial injury with loss of consciousness of unspecified duration, initial encounter: Secondary | ICD-10-CM

## 2021-04-05 DIAGNOSIS — G43909 Migraine, unspecified, not intractable, without status migrainosus: Secondary | ICD-10-CM

## 2021-04-05 DIAGNOSIS — J45909 Unspecified asthma, uncomplicated: Secondary | ICD-10-CM

## 2021-04-05 DIAGNOSIS — K219 Gastro-esophageal reflux disease without esophagitis: Secondary | ICD-10-CM

## 2021-04-05 DIAGNOSIS — J449 Chronic obstructive pulmonary disease, unspecified: Secondary | ICD-10-CM

## 2021-04-05 DIAGNOSIS — J189 Pneumonia, unspecified organism: Secondary | ICD-10-CM

## 2021-04-05 DIAGNOSIS — N200 Calculus of kidney: Secondary | ICD-10-CM

## 2021-04-05 DIAGNOSIS — S99922S Unspecified injury of left foot, sequela: Secondary | ICD-10-CM

## 2021-04-05 DIAGNOSIS — F419 Anxiety disorder, unspecified: Secondary | ICD-10-CM

## 2021-04-05 DIAGNOSIS — G4700 Insomnia, unspecified: Secondary | ICD-10-CM

## 2021-04-05 DIAGNOSIS — G8921 Chronic pain due to trauma: Secondary | ICD-10-CM

## 2021-04-05 DIAGNOSIS — S329XXA Fracture of unspecified parts of lumbosacral spine and pelvis, initial encounter for closed fracture: Secondary | ICD-10-CM

## 2021-04-05 DIAGNOSIS — R1110 Vomiting, unspecified: Secondary | ICD-10-CM

## 2021-04-05 DIAGNOSIS — M1990 Unspecified osteoarthritis, unspecified site: Secondary | ICD-10-CM

## 2021-04-05 DIAGNOSIS — G629 Polyneuropathy, unspecified: Secondary | ICD-10-CM

## 2021-04-05 DIAGNOSIS — F32A Depression: Secondary | ICD-10-CM

## 2021-04-05 DIAGNOSIS — M5136 Other intervertebral disc degeneration, lumbar region: Secondary | ICD-10-CM

## 2021-04-05 DIAGNOSIS — M359 Systemic involvement of connective tissue, unspecified: Secondary | ICD-10-CM

## 2021-04-05 DIAGNOSIS — I951 Orthostatic hypotension: Secondary | ICD-10-CM

## 2021-04-05 MED ORDER — FENTANYL CITRATE (PF) 50 MCG/ML IJ SOLN
INTRAVENOUS | 0 refills | Status: DC
Start: 2021-04-05 — End: 2021-04-05
  Administered 2021-04-05: 20:00:00 100 ug via INTRAVENOUS

## 2021-04-05 MED ORDER — PROPOFOL INJ 10 MG/ML IV VIAL
INTRAVENOUS | 0 refills | Status: DC
Start: 2021-04-05 — End: 2021-04-05
  Administered 2021-04-05: 20:00:00 150 mg via INTRAVENOUS

## 2021-04-05 MED ORDER — PHENYLEPHRINE HCL IN 0.9% NACL 1 MG/10 ML (100 MCG/ML) IV SYRG
INTRAVENOUS | 0 refills | Status: DC
Start: 2021-04-05 — End: 2021-04-05
  Administered 2021-04-05: 21:00:00 100 ug via INTRAVENOUS

## 2021-04-05 MED ORDER — ONDANSETRON HCL (PF) 4 MG/2 ML IJ SOLN
INTRAVENOUS | 0 refills | Status: DC
Start: 2021-04-05 — End: 2021-04-05
  Administered 2021-04-05: 21:00:00 4 mg via INTRAVENOUS

## 2021-04-05 MED ORDER — ARTIFICIAL TEARS (PF) SINGLE DOSE DROPS GROUP
OPHTHALMIC | 0 refills | Status: DC
Start: 2021-04-05 — End: 2021-04-05
  Administered 2021-04-05: 20:00:00 2 [drp] via OPHTHALMIC

## 2021-04-05 MED ORDER — LIDOCAINE (PF) 200 MG/10 ML (2 %) IJ SYRG
INTRAVENOUS | 0 refills | Status: DC
Start: 2021-04-05 — End: 2021-04-05
  Administered 2021-04-05: 20:00:00 60 mg via INTRAVENOUS

## 2021-04-05 MED ORDER — DEXAMETHASONE SODIUM PHOSPHATE 4 MG/ML IJ SOLN
INTRAVENOUS | 0 refills | Status: DC
Start: 2021-04-05 — End: 2021-04-05
  Administered 2021-04-05: 21:00:00 4 mg via INTRAVENOUS

## 2021-04-05 MED ORDER — MIDAZOLAM 1 MG/ML IJ SOLN
INTRAVENOUS | 0 refills | Status: DC
Start: 2021-04-05 — End: 2021-04-05
  Administered 2021-04-05: 20:00:00 2 mg via INTRAVENOUS

## 2021-04-05 MED ORDER — SUCCINYLCHOLINE CHLORIDE 20 MG/ML IJ SOLN
INTRAVENOUS | 0 refills | Status: DC
Start: 2021-04-05 — End: 2021-04-05
  Administered 2021-04-05: 20:00:00 120 mg via INTRAVENOUS

## 2021-04-05 MED ORDER — ROCURONIUM 10 MG/ML IV SOLN
INTRAVENOUS | 0 refills | Status: DC
Start: 2021-04-05 — End: 2021-04-05
  Administered 2021-04-05: 21:00:00 30 mg via INTRAVENOUS

## 2021-04-05 MED ORDER — SUGAMMADEX 100 MG/ML IV SOLN
INTRAVENOUS | 0 refills | Status: DC
Start: 2021-04-05 — End: 2021-04-05
  Administered 2021-04-05: 21:00:00 200 mg via INTRAVENOUS

## 2021-04-05 MED ADMIN — FENTANYL CITRATE (PF) 50 MCG/ML IJ SOLN [3037]: 25 ug | INTRAVENOUS | @ 22:00:00 | Stop: 2021-04-06 | NDC 00409909412

## 2021-04-05 MED ADMIN — LACTATED RINGERS IV SOLP [4318]: 1000 mL | INTRAVENOUS | @ 20:00:00 | Stop: 2021-04-06 | NDC 00338011704

## 2021-04-05 MED ADMIN — ACETAMINOPHEN 500 MG PO TAB [102]: 1000 mg | ORAL | @ 22:00:00 | Stop: 2021-04-05 | NDC 00904673061

## 2021-04-05 MED ADMIN — CEFAZOLIN INJ 1GM IVP [210319]: 2 g | INTRAVENOUS | @ 21:00:00 | Stop: 2021-04-05 | NDC 00409080501

## 2021-04-06 ENCOUNTER — Encounter: Admit: 2021-04-06 | Discharge: 2021-04-06 | Payer: MEDICARE

## 2021-04-06 MED ORDER — HYOSCYAMINE SULFATE 0.125 MG PO TAB
125 ug | ORAL_TABLET | ORAL | 0 refills | Status: AC | PRN
Start: 2021-04-06 — End: ?

## 2021-04-06 NOTE — Telephone Encounter
Patient called and LVM stating that her insurance doesn't cover the hyoscyamine that was prescribed and was wanting to see if she could get something else. I called and spoke with the patient and informed her that through [email or web address] she can get the medication at a discounted price. I listed off what they were offering and she requested I send the medication to [facility] in [place], [place]. I confirmed the pharmacy and sent the prescription. She also stated that she has been feeling worse with this stent than a previous stent she has had. I educated that stents can affect people differently and this time it may just be causing more irritation than the last one. I advised she take the prescribed medications as they will help with her symptoms. She verbalized understanding and stated that she understands her potassium plays a role in the formation of her kidney stones. She currently takes potassium supplements but she has had bariatric surgery and is worried the vitamins aren't helping enough. She verbalized interest in getting potassium infusions, she stated she gets infusions already and figured it would be easy to do them at the same time. I stated that I wasn't sure if we could order that but I can relay the message to Dr. Encarnacion Chu team for advising. She verbalized understanding and appreciated the call back.

## 2021-04-07 ENCOUNTER — Encounter: Admit: 2021-04-07 | Discharge: 2021-04-07 | Payer: MEDICARE

## 2021-04-07 DIAGNOSIS — J189 Pneumonia, unspecified organism: Secondary | ICD-10-CM

## 2021-04-07 DIAGNOSIS — J302 Other seasonal allergic rhinitis: Secondary | ICD-10-CM

## 2021-04-07 DIAGNOSIS — I951 Orthostatic hypotension: Secondary | ICD-10-CM

## 2021-04-07 DIAGNOSIS — M359 Systemic involvement of connective tissue, unspecified: Secondary | ICD-10-CM

## 2021-04-07 DIAGNOSIS — K219 Gastro-esophageal reflux disease without esophagitis: Secondary | ICD-10-CM

## 2021-04-07 DIAGNOSIS — S329XXA Fracture of unspecified parts of lumbosacral spine and pelvis, initial encounter for closed fracture: Secondary | ICD-10-CM

## 2021-04-07 DIAGNOSIS — J42 Unspecified chronic bronchitis: Secondary | ICD-10-CM

## 2021-04-07 DIAGNOSIS — F419 Anxiety disorder, unspecified: Secondary | ICD-10-CM

## 2021-04-07 DIAGNOSIS — F32A Depression: Secondary | ICD-10-CM

## 2021-04-07 DIAGNOSIS — G8921 Chronic pain due to trauma: Secondary | ICD-10-CM

## 2021-04-07 DIAGNOSIS — J45909 Unspecified asthma, uncomplicated: Secondary | ICD-10-CM

## 2021-04-07 DIAGNOSIS — M5136 Other intervertebral disc degeneration, lumbar region: Secondary | ICD-10-CM

## 2021-04-07 DIAGNOSIS — E610 Copper deficiency: Secondary | ICD-10-CM

## 2021-04-07 DIAGNOSIS — N200 Calculus of kidney: Secondary | ICD-10-CM

## 2021-04-07 DIAGNOSIS — Z9884 Bariatric surgery status: Secondary | ICD-10-CM

## 2021-04-07 DIAGNOSIS — S069X9A Unspecified intracranial injury with loss of consciousness of unspecified duration, initial encounter: Secondary | ICD-10-CM

## 2021-04-07 DIAGNOSIS — G43909 Migraine, unspecified, not intractable, without status migrainosus: Secondary | ICD-10-CM

## 2021-04-07 DIAGNOSIS — S99922S Unspecified injury of left foot, sequela: Secondary | ICD-10-CM

## 2021-04-07 DIAGNOSIS — G629 Polyneuropathy, unspecified: Secondary | ICD-10-CM

## 2021-04-07 DIAGNOSIS — G4700 Insomnia, unspecified: Secondary | ICD-10-CM

## 2021-04-07 DIAGNOSIS — M1990 Unspecified osteoarthritis, unspecified site: Secondary | ICD-10-CM

## 2021-04-07 DIAGNOSIS — R1110 Vomiting, unspecified: Secondary | ICD-10-CM

## 2021-04-07 DIAGNOSIS — J449 Chronic obstructive pulmonary disease, unspecified: Secondary | ICD-10-CM

## 2021-04-13 ENCOUNTER — Encounter: Admit: 2021-04-13 | Discharge: 2021-04-13 | Payer: MEDICARE

## 2021-04-13 NOTE — Telephone Encounter
Gabriela Hunt requested I call her caregiver at [phone] to discuss her stent.  Spoke with her caregiver who states that they are unable to travel to [place] on Tuesday and Thursday and will need to reschedule her stent removal tomorrow.  Advised that I would send a message to [facility] team to see about getting her appointment rescheduled. No other questions or concerns at this time.

## 2021-04-15 ENCOUNTER — Encounter: Admit: 2021-04-15 | Discharge: 2021-04-15 | Payer: MEDICARE

## 2021-04-15 ENCOUNTER — Ambulatory Visit: Admit: 2021-04-15 | Discharge: 2021-04-15 | Payer: MEDICARE

## 2021-04-15 DIAGNOSIS — G629 Polyneuropathy, unspecified: Secondary | ICD-10-CM

## 2021-04-15 DIAGNOSIS — M359 Systemic involvement of connective tissue, unspecified: Secondary | ICD-10-CM

## 2021-04-15 DIAGNOSIS — R1110 Vomiting, unspecified: Secondary | ICD-10-CM

## 2021-04-15 DIAGNOSIS — J45909 Unspecified asthma, uncomplicated: Secondary | ICD-10-CM

## 2021-04-15 DIAGNOSIS — G8921 Chronic pain due to trauma: Secondary | ICD-10-CM

## 2021-04-15 DIAGNOSIS — G4700 Insomnia, unspecified: Secondary | ICD-10-CM

## 2021-04-15 DIAGNOSIS — M5136 Other intervertebral disc degeneration, lumbar region: Secondary | ICD-10-CM

## 2021-04-15 DIAGNOSIS — K219 Gastro-esophageal reflux disease without esophagitis: Secondary | ICD-10-CM

## 2021-04-15 DIAGNOSIS — E610 Copper deficiency: Secondary | ICD-10-CM

## 2021-04-15 DIAGNOSIS — J449 Chronic obstructive pulmonary disease, unspecified: Secondary | ICD-10-CM

## 2021-04-15 DIAGNOSIS — S329XXA Fracture of unspecified parts of lumbosacral spine and pelvis, initial encounter for closed fracture: Secondary | ICD-10-CM

## 2021-04-15 DIAGNOSIS — J189 Pneumonia, unspecified organism: Secondary | ICD-10-CM

## 2021-04-15 DIAGNOSIS — N200 Calculus of kidney: Secondary | ICD-10-CM

## 2021-04-15 DIAGNOSIS — F32A Depression: Secondary | ICD-10-CM

## 2021-04-15 DIAGNOSIS — J42 Unspecified chronic bronchitis: Secondary | ICD-10-CM

## 2021-04-15 DIAGNOSIS — S99922S Unspecified injury of left foot, sequela: Secondary | ICD-10-CM

## 2021-04-15 DIAGNOSIS — S069X9A Unspecified intracranial injury with loss of consciousness of unspecified duration, initial encounter: Secondary | ICD-10-CM

## 2021-04-15 DIAGNOSIS — F419 Anxiety disorder, unspecified: Secondary | ICD-10-CM

## 2021-04-15 DIAGNOSIS — I951 Orthostatic hypotension: Secondary | ICD-10-CM

## 2021-04-15 DIAGNOSIS — M1990 Unspecified osteoarthritis, unspecified site: Secondary | ICD-10-CM

## 2021-04-15 DIAGNOSIS — Z9884 Bariatric surgery status: Secondary | ICD-10-CM

## 2021-04-15 DIAGNOSIS — J302 Other seasonal allergic rhinitis: Secondary | ICD-10-CM

## 2021-04-15 DIAGNOSIS — G43909 Migraine, unspecified, not intractable, without status migrainosus: Secondary | ICD-10-CM

## 2021-04-15 MED ORDER — SULFAMETHOXAZOLE-TRIMETHOPRIM 800-160 MG PO TAB
1 | ORAL_TABLET | Freq: Two times a day (BID) | ORAL | 0 refills | Status: AC
Start: 2021-04-15 — End: ?

## 2021-04-15 NOTE — Procedures
Encounter Date:  04/15/2021    Procedure: Flexible Cystoscopy & Stent Removal    Provider:  Marin Roberts, PA-C  Staff: Daryl Eastern, MD.  Staff not present, but immediately available.    Anesthesia:  Intraurethral 2% Lidocaine Gel  Complications: None    Indication for procedure: 55 y.o. female w/ nephrolithiasis s/p URS w/ laser lithotripsy on 04/05/2021, by Dr. Luther Redo.  Presents to clinic today for cysto w/ stent removal.    Time out performed.  After informed consent was obtained, he was prepped & draped in the usual fashion.  2% Lidocaine gel administered per urethra.  After adequeate time for anesthetic effect, the flexible cystoscope was gently advanced into the urethra under direct visualization.  Urethra: (-)lesions/ strictures.  Upon entering the bladder, the stent was visualized from the (L) UO.  The stent was grasped using flexible forceps.  The stent slipped out of the graspers a 2-3 times while trying to withdraw the cystoscope.   Ultimately, the stent & scope were withdrawn simulatanesously.  She tolerated the procedure well & left the room in a pleasant disposition.      A/P:  Visit Dx:  1. Nephrolithiasis        Stent removed today w/o difficulty  Complete abx  US Renal ~ 3 mo.  -- pt states she will get this completed locally in [place], [place].  RTC ~ 4 mo w/ Dr. Luther Redo to review [place] results.      Orders Placed This Encounter    CYSTO W/STENT REMOVAL    trimethoprim/sulfamethoxazole (BACTRIM DS) 160/800 mg tablet       Marin Roberts, PA-C  Urology

## 2021-04-15 NOTE — Assessment & Plan Note
US Renal ~ 3 mo (already ordered in chart)  -- Dr. Luther Redo will call pt w/ result.  F/u TBD based on [place] result.

## 2021-05-05 NOTE — Patient Instructions
--   If you are having acute (new/sudden onset) or severe/worsening neurologic symptoms, please call 911 or seek care in ED.    -- If you have internet access/smart phone please contact me through MyChart. This is our preferred method of contact and the most efficient way to contact your healthcare team. If you do not have internet access/smart phone you can call at [phone].   -- Preferred method of communication is through [employer], if the issue cannot wait until your next scheduled follow up.   -- MyChart may be used for non-emergent communication. Emails are not reviewed after hours or over the weekend/holidays/after 4PM. Staff will reply to your email within 24-48 business hours.     -- Please do not leave multiple voicemails for [place], leaving multiple voicemails delay [place] in getting back to you quicker.    -- If you do not hear from [place] within one week of a lab or imaging study being completed, please call/send my chart email to the office to be sure that we have received the results. This is especially challenging when tests are done outside of the [facility] system, as many times results do not make it back to our office for a variety of reasons. In our office no news is good news does not apply. You should hear from [place] with results for each test.    -- For referrals placed during the visit, if you have not heard from scheduling within one week, please call the call center at [phone] to get scheduling assistance.  -- For radiology referrals placed during the visit, if you have not heard from scheduling within one week, please call the call center at [phone] to get scheduling assistance.    -- For medication refills, please first contact your pharmacy, who will fax a refill authorization request form to our office.  Weekdays only. Allow up to 2 business days for refills. Please plan ahead.  -- Allow one business week for our office to complete any requested paperwork (FMLA, etc.)     -- Our front desk staff, Samul Dada, or Marcelino Duster may be reached at [phone] for scheduling needs.   Huntley Dec, RN, may be contacted at [phone] for urgent needs. Staff will return your call within 24 business hours.     For Appointments:   -- Please try to arrive early for your appointment time to help facilitate your visit.   -- If you are late to your appointment, we reserve the right to ask you to reschedule or wait until next available time to be seen in fairness to other patients scheduled that day.   -- There are times when we are running behind in clinic. Our goal is to always be on time, however, there are time when unexpected events occur with patients, which may cause a delay. We appreciate your understanding when this occurs.  -- Please stop at the front desk after each visit to make sure we schedule your follow-up visit and print any referrals or labs for you.

## 2021-05-11 ENCOUNTER — Encounter: Admit: 2021-05-11 | Discharge: 2021-05-11 | Payer: MEDICARE

## 2021-05-11 ENCOUNTER — Ambulatory Visit: Admit: 2021-05-11 | Discharge: 2021-05-12 | Payer: MEDICARE

## 2021-05-11 DIAGNOSIS — E610 Copper deficiency: Secondary | ICD-10-CM

## 2021-05-11 DIAGNOSIS — K219 Gastro-esophageal reflux disease without esophagitis: Secondary | ICD-10-CM

## 2021-05-11 DIAGNOSIS — Z9884 Bariatric surgery status: Secondary | ICD-10-CM

## 2021-05-11 DIAGNOSIS — G43909 Migraine, unspecified, not intractable, without status migrainosus: Secondary | ICD-10-CM

## 2021-05-11 DIAGNOSIS — J449 Chronic obstructive pulmonary disease, unspecified: Secondary | ICD-10-CM

## 2021-05-11 DIAGNOSIS — J189 Pneumonia, unspecified organism: Secondary | ICD-10-CM

## 2021-05-11 DIAGNOSIS — J42 Unspecified chronic bronchitis: Secondary | ICD-10-CM

## 2021-05-11 DIAGNOSIS — R4789 Other speech disturbances: Secondary | ICD-10-CM

## 2021-05-11 DIAGNOSIS — N200 Calculus of kidney: Secondary | ICD-10-CM

## 2021-05-11 DIAGNOSIS — J45909 Unspecified asthma, uncomplicated: Secondary | ICD-10-CM

## 2021-05-11 DIAGNOSIS — G4700 Insomnia, unspecified: Secondary | ICD-10-CM

## 2021-05-11 DIAGNOSIS — I951 Orthostatic hypotension: Secondary | ICD-10-CM

## 2021-05-11 DIAGNOSIS — G629 Polyneuropathy, unspecified: Secondary | ICD-10-CM

## 2021-05-11 DIAGNOSIS — J302 Other seasonal allergic rhinitis: Secondary | ICD-10-CM

## 2021-05-11 DIAGNOSIS — G8921 Chronic pain due to trauma: Secondary | ICD-10-CM

## 2021-05-11 DIAGNOSIS — R1110 Vomiting, unspecified: Secondary | ICD-10-CM

## 2021-05-11 DIAGNOSIS — M5136 Other intervertebral disc degeneration, lumbar region: Secondary | ICD-10-CM

## 2021-05-11 DIAGNOSIS — F32A Depression: Secondary | ICD-10-CM

## 2021-05-11 DIAGNOSIS — M359 Systemic involvement of connective tissue, unspecified: Secondary | ICD-10-CM

## 2021-05-11 DIAGNOSIS — F419 Anxiety disorder, unspecified: Secondary | ICD-10-CM

## 2021-05-11 DIAGNOSIS — S329XXA Fracture of unspecified parts of lumbosacral spine and pelvis, initial encounter for closed fracture: Secondary | ICD-10-CM

## 2021-05-11 DIAGNOSIS — Z87820 Personal history of traumatic brain injury: Secondary | ICD-10-CM

## 2021-05-11 DIAGNOSIS — S99922S Unspecified injury of left foot, sequela: Secondary | ICD-10-CM

## 2021-05-11 DIAGNOSIS — M1990 Unspecified osteoarthritis, unspecified site: Secondary | ICD-10-CM

## 2021-05-11 DIAGNOSIS — S069X9A Unspecified intracranial injury with loss of consciousness of unspecified duration, initial encounter: Secondary | ICD-10-CM

## 2021-05-11 DIAGNOSIS — G43009 Migraine without aura, not intractable, without status migrainosus: Secondary | ICD-10-CM

## 2021-05-11 MED ORDER — RIZATRIPTAN 5 MG PO TBDI
ORAL_TABLET | 3 refills | Status: AC
Start: 2021-05-11 — End: ?

## 2021-05-11 NOTE — Progress Notes
Obtained patient's verbal consent to treat them and their agreement to [facility] financial policy and NPP via this telehealth visit during the [facility]    The following visit was completed via Zoom (Audio/Video).    Gabriela Hunt is a 55 y.o. female.    CC: migraine       History of Present Illness    She presents for evaluation in regard to migraines. These tend to be right sided and more posterior. has sound and light sensitivity. Character is often stabbing. Also gets nausea with headaches. Migraines occur 5-7x per month, other days are generally free of headache. Migraines seme to have started around age 53.    Prior head injuries:  2011 - slipped when exiting the shower, lost balance and fell backward and hit the back of the he ad on a towel bar. Lost awareness for an uncertain period of time.    In Feb 2021 she had an episode of LOC which lead to a fall and right frontal head injury. She believes she had 8-10 minutes of LOC. Had CT head at the time with [place].    She occasionally has symptoms of passing out when standing quickly.     Current medications:  100mg  sumatriptan - shortens migraine  lyrica    Prior medications:  25mg  topiramate twice daily  gabapentin    She reports she has expressive aphasia since her last fall. Also endorses some memory difficulties. Worked with speech therapy at [facility] in [place].       Medical History:   Diagnosis Date   ? Anxiety    ? Arthritis    ? Asthma    ? Autoimmune disease (HCC)    ? Chronic bronchitis (HCC)    ? Chronic pain after traumatic injury    ? 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 COPD (chronic obstructive pulmonary disease) (HCC)     second hand smoke exposure from parents.   ? Copper deficiency    ? DDD (degenerative disc disease), lumbar    ? Depression    ? Foot injury, left, sequela    ? GERD (gastroesophageal reflux disease)    ? Insomnia    ? Kidney stone    ? Migraine    ? Neuropathy     feet, hands, and legs ? Pelvic fracture (HCC)    ? Pneumonia    ? Seasonal allergic reaction    ? Status post biliopancreatic diversion with duodenal switch    ? Syncope due to orthostatic hypotension    ? TBI (traumatic brain injury) (HCC)     with short term memory loss, hard to focus, left little finger is numb.   ? TBI (traumatic brain injury) (HCC) 2021     Surgical History:   Procedure Laterality Date   ? EXCISION EXCESSIVE SKIN/ SUBCUTANEOUS TISSUE - ABDOMEN WITH INFRAUMBILICAL PANNICULECTOMY, UMBILECTOMY, APPLICATION NEGATIVE PRESSURE WOUND THERAPY N/A 11/15/2018    Performed by Ursula Beath, MD at [facility] OR   ? SURGERY Left 2021    Broken wrist repair   ? MAMMAPLASTY REDUCTION Bilateral 06/01/2020    Performed by Marlinda Mike, MD at [facility] OR   ? RECONSTRUCTION NIPPLE/ AREOLA Bilateral 06/01/2020    Performed by Marlinda Mike, MD at [facility] OR   ? CYSTOURETHROSCOPY WITH URETEROSCOPY AND/ OR PYELOSCOPY - WITH LITHOTRIPSY INCLUDING INSERTION URETERAL STENT Left 04/05/2021    Performed by Beckie Busing, MD at [facility]  OR   ? BARIATRIC SURGERY     ? CESAREAN SECTION, CLASSIC     ? CHOLECYSTECTOMY     ? ENDOMETRIAL ABLATION      times 3   ? HYSTERECTOMY     ? KNEE ARTHROPLASTY Bilateral    ? OTHER SURGICAL HISTORY      as per PMH/problem list   ? TONSILLECTOMY       Social History     Socioeconomic History   ? Marital status: Single   ? Number of children: 1   Tobacco Use   ? Smoking status: Never Smoker   ? Smokeless tobacco: Never Used   Vaping Use   ? Vaping Use: Never used   Substance and Sexual Activity   ? Alcohol use: Yes     Comment: socially    ? Drug use: Never   Social History Narrative    Lives alone, not working, on disability from chronic pain and TBI     History reviewed. No pertinent family history.    Review of Systems      Objective:         ? acetaminophen (TYLENOL) 500 mg tablet Take two tablets by mouth every 6 hours. Max of 4,000 mg of acetaminophen in 24 hours.    Please take 2 Tablets (1000 mg) every 6 hrs for the first three days after your procedure. After three days, you may take Tylenol as needed.   ? ADVAIR HFA 230-21 mcg/actuation inhaler INHALE 2 PUFFS BY MOUTH EVERY TWELVE HOURS   ? albuterol 0.5% (PROVENTIL) 2.5 mg/0.5 mL nebulizer solution Inhale 2.5 mg solution by nebulizer as directed every 6 hours as needed for Shortness of Breath or Wheezing.   ? albuterol sulfate (PROAIR HFA) 90 mcg/actuation HFA aerosol inhaler Inhale 2 puffs by mouth into the lungs every 6 hours as needed for Wheezing or Shortness of Breath. Shake well before use.    ? aspirin EC 81 mg tablet Take 81 mg by mouth daily.   ? baclofen (LIORESAL) 10 mg tablet Take one tablet by mouth every 6 hours.   ? benztropine (COGENTIN) 1 mg tablet    ? biotin 1,000 mcg chew Chew 1 tablet by mouth daily.   ? busPIRone (BUSPAR) 5 mg tablet    ? calcium carbonate (CALCIUM 600 PO) Take 1 tablet by mouth daily.   ? cetirizine (ZYRTEC) 10 mg tablet Take 10 mg by mouth daily.   ? cholecalciferol(+) (Vitamin D3) 50,000 units capsule Take 50,000 Units by mouth twice weekly.   ? Copper Gluconate 2 mg tab Take 2 mg by mouth daily.   ? cyanocobalamin (VITAMIN B-12, RUBRAMIN) 1,000 mcg/mL injection Inject 1 mL into the muscle every 30 days.   ? duloxetine DR (CYMBALTA) 30 mg capsule Take 30 mg by mouth twice daily.   ? ELDERBERRY FRUIT PO Take 1 capsule by mouth twice daily. 2000mg    ? ergocalciferol (vitamin D2) (DRISDOL) 1,250 mcg (50,000 unit) capsule    ? ferrous sulfate (FEOSOL) 325 mg (65 mg iron) tablet Take 325 mg by mouth daily. Take on an empty stomach at least 1 hour before or 2 hours after food.   ? fluconazole (DIFLUCAN) 150 mg tablet Take 150 mg by mouth daily as needed (for topical yeast infection).   ? fludrocortisone (FLORINEF) 0.1 mg tablet Take 0.1 mg by mouth daily.   ? fluoxetine (PROZAC) 40 mg capsule Take 40 mg by mouth daily.   ? HYDROcodone/acetaminophen (NORCO) 10/325 mg tablet    ?  hyoscyamine sulfate (LEVSIN) 0.125 mg tablet Take one tablet by mouth every 4 hours as needed (bladder spasms).   ? loperamide (IMODIUM) 2 mg capsule Take 2 mg by mouth as Needed for Diarrhea.   ? LORazepam (ATIVAN) 0.5 mg tablet Take 1 tablet by mouth every 6 hours as needed for Nausea.   ? midodrine (PROAMATINE) 5 mg tablet    ? montelukast (SINGULAIR) 10 mg tablet Take one tablet by mouth at bedtime daily.   ? nystatin (MYCOSTATIN) 100,000 unit/g topical cream Apply  topically to affected area twice daily as needed (for topical yeast infection).   ? nystatin (NYSTOP) 100,000 unit/g topical powder Apply 1 g topically to affected area as Needed.   ? omeprazole DR(+) (PRILOSEC) 20 mg capsule Take 20 mg by mouth daily.   ? ondansetron (ZOFRAN ODT) 4 mg rapid dissolve tablet    ? oxyCODONE-acetaminophen(+) (PERCOCET; ENDOCET) 7.5-325 mg tablet Take 1 tablet by mouth three times daily     ? phenazopyridine (PYRIDIUM) 200 mg tablet Take one tablet by mouth three times daily as needed. Take after meals for up to 2 days.   ? potassium citrate (UROCIT-K) 10 mEq (1,080 mg) tablet    ? pramipexole (MIRAPEX) 0.125 mg tablet    ? pregabalin (LYRICA) 75 mg capsule    ? promethazine (PHENERGAN) 25 mg tablet Take 25 mg by mouth as Needed.   ? rizatriptan (MAXALT-MLT) 5 mg rapid dissolve tablet Take one tablet by mouth at onset of headache. May repeat in 2 hours if needed.   ? sucralfate (CARAFATE) 1 gram tablet    ? Syringe (Disposable) 5 mL syrg Use  as directed.   ? tamsulosin (FLOMAX) 0.4 mg capsule Take one capsule by mouth daily as needed. Do not crush, chew or open capsules. Take 30 minutes following the same meal each day.   ? traZODone (DESYREL) 50 mg tablet    ? vitamins, multiple cap Take 1 capsule by mouth daily.   ? zolpidem CR(+) (AMBIEN CR) 12.5 mg tablet Take 12.5 mg by mouth at bedtime as needed for Sleep. Indications: difficulty sleeping     There were no vitals filed for this visit.  There is no height or weight on file to calculate BMI.     Physical Exam    Alert and in no distress.  Converses and answers questions appropriately.  Speech is normal without dysarthria.  Face is symmetric with normal movements.         Assessment and Plan:    Impression:  1. Episodic migraine without aura.  Experiences approximately 5-7 migraines per month.  Imitrex provides some benefit, but does not relieve the headache completely.  2. History of concussion.  She reports concussions in 2021 in 2011 which occurred in the context of ground-level falls in which resulted in transient loss of awareness.  Most recently had a head CT after her last concussion that was reportedly normal.  3. Word finding difficulties/? Aphasia.  She notes persistent word finding difficulty since her most recent head injury in February 2021.  Was previously working with speech therapy and had been told this may represent aphasia.  Unclear specific etiology, somewhat atypical as the head injury she experienced does not correspond with typical localization for language deficits such as aphasia.  Ultimately may be multifactorial in etiology.  4. Polypharmacy.  She is on numerous medications and reports that some of the medications on our current list she does not believe she is taking.  Unfortunately,  she is unable to provide full history of when it may have been stopped and expresses some uncertainty I will as to whether she is taking some these medications.  We will request an updated complete list from her PCP.    Plan:  1. Will discontinue sumatriptan due insufficient benefit.  2. Instead start 5 mg Maxalt daily as needed.  3. Obtain MRI head due to aphasia/language deficits after most recent concussion.  4. Will refer to local speech therapy at [facility] to continue working on language deficits.  5. Instructed her to reach out by phone or MyChart in the next 4 to 6 weeks to update [place] on her response to Maxalt.  6. Could consider preventative medications in the future depending on overall headache burden, though will need an updated medication list in order to ensure no interactions.  7. Follow up in about 6 months.    Total Time Today was 45 minutes in the following activities: Preparing to see the patient, Obtaining and/or reviewing separately obtained history, Performing a medically appropriate examination and/or evaluation, Counseling and educating the patient/family/caregiver, Ordering medications, tests, or procedures and Documenting clinical information in the electronic or other health record

## 2021-07-04 ENCOUNTER — Encounter: Admit: 2021-07-04 | Discharge: 2021-07-04 | Payer: MEDICARE

## 2021-07-29 ENCOUNTER — Encounter: Admit: 2021-07-29 | Discharge: 2021-07-29 | Payer: MEDICARE

## 2021-08-22 ENCOUNTER — Encounter: Admit: 2021-08-22 | Discharge: 2021-08-22 | Payer: MEDICARE

## 2021-08-22 NOTE — Telephone Encounter
MyChart message from patient, requesting visit scheduled for 11/9 to be made a telehealth visit. Called patient to see if she had completed her [place] and/or CT. States she only completed an [place]. Did not see any results from [facility]. Contacted them to cloud images. Will change appointment to telehealth.

## 2021-08-23 ENCOUNTER — Encounter: Admit: 2021-08-23 | Discharge: 2021-08-23 | Payer: MEDICARE

## 2021-08-23 NOTE — Telephone Encounter
Requesting a telephone call vs telehealth since she does not have a camera on her computer.  Will let Dr. Luther Redo be aware of request.

## 2021-10-12 ENCOUNTER — Encounter: Admit: 2021-10-12 | Discharge: 2021-10-12 | Payer: MEDICARE

## 2021-10-12 NOTE — Telephone Encounter
Time: 4:30    Contacted pt to introduce group therapy as an option. Pt was not interested in participating in group therapy at this time but hopes to be connected with an individual therapist.    Gabriela Belton, PhD

## 2021-10-14 ENCOUNTER — Encounter: Admit: 2021-10-14 | Discharge: 2021-10-14 | Payer: MEDICARE

## 2021-10-14 DIAGNOSIS — N200 Calculus of kidney: Secondary | ICD-10-CM

## 2021-10-26 ENCOUNTER — Encounter: Admit: 2021-10-26 | Discharge: 2021-10-26 | Payer: MEDICARE

## 2021-11-02 ENCOUNTER — Ambulatory Visit: Admit: 2021-11-02 | Discharge: 2021-11-02 | Payer: MEDICARE

## 2021-11-02 ENCOUNTER — Encounter: Admit: 2021-11-02 | Discharge: 2021-11-02 | Payer: MEDICARE

## 2021-11-02 DIAGNOSIS — G8921 Chronic pain due to trauma: Secondary | ICD-10-CM

## 2021-11-02 DIAGNOSIS — R1110 Vomiting, unspecified: Secondary | ICD-10-CM

## 2021-11-02 DIAGNOSIS — M5136 Other intervertebral disc degeneration, lumbar region: Secondary | ICD-10-CM

## 2021-11-02 DIAGNOSIS — J302 Other seasonal allergic rhinitis: Secondary | ICD-10-CM

## 2021-11-02 DIAGNOSIS — N200 Calculus of kidney: Secondary | ICD-10-CM

## 2021-11-02 DIAGNOSIS — K219 Gastro-esophageal reflux disease without esophagitis: Secondary | ICD-10-CM

## 2021-11-02 DIAGNOSIS — G4700 Insomnia, unspecified: Secondary | ICD-10-CM

## 2021-11-02 DIAGNOSIS — J42 Unspecified chronic bronchitis: Secondary | ICD-10-CM

## 2021-11-02 DIAGNOSIS — Z9884 Bariatric surgery status: Secondary | ICD-10-CM

## 2021-11-02 DIAGNOSIS — J45909 Unspecified asthma, uncomplicated: Secondary | ICD-10-CM

## 2021-11-02 DIAGNOSIS — M1990 Unspecified osteoarthritis, unspecified site: Secondary | ICD-10-CM

## 2021-11-02 DIAGNOSIS — S069XAA TBI (traumatic brain injury): Secondary | ICD-10-CM

## 2021-11-02 DIAGNOSIS — M359 Systemic involvement of connective tissue, unspecified: Secondary | ICD-10-CM

## 2021-11-02 DIAGNOSIS — F419 Anxiety disorder, unspecified: Secondary | ICD-10-CM

## 2021-11-02 DIAGNOSIS — F32A Depression: Secondary | ICD-10-CM

## 2021-11-02 DIAGNOSIS — I951 Orthostatic hypotension: Secondary | ICD-10-CM

## 2021-11-02 DIAGNOSIS — G43909 Migraine, unspecified, not intractable, without status migrainosus: Secondary | ICD-10-CM

## 2021-11-02 DIAGNOSIS — S329XXA Fracture of unspecified parts of lumbosacral spine and pelvis, initial encounter for closed fracture: Secondary | ICD-10-CM

## 2021-11-02 DIAGNOSIS — E610 Copper deficiency: Secondary | ICD-10-CM

## 2021-11-02 DIAGNOSIS — J449 Chronic obstructive pulmonary disease, unspecified: Secondary | ICD-10-CM

## 2021-11-02 DIAGNOSIS — G629 Polyneuropathy, unspecified: Secondary | ICD-10-CM

## 2021-11-02 DIAGNOSIS — S99922S Unspecified injury of left foot, sequela: Secondary | ICD-10-CM

## 2021-11-02 DIAGNOSIS — J189 Pneumonia, unspecified organism: Secondary | ICD-10-CM

## 2021-11-02 MED ORDER — SODIUM CHLORIDE 0.9 % IJ SOLN
50 mL | Freq: Once | INTRAVENOUS | 0 refills | Status: AC
Start: 2021-11-02 — End: ?

## 2021-11-02 MED ORDER — IOHEXOL 350 MG IODINE/ML IV SOLN
80 mL | Freq: Once | INTRAVENOUS | 0 refills | Status: AC
Start: 2021-11-02 — End: ?

## 2021-11-02 NOTE — Progress Notes
ReSKU Follow-up Note  Date: 11/02/2021     Subjective   Gabriela Hunt was seen in the Urology faculty practice clinic at [facility] for a follow-up visit regarding their nephrolithiasis. As you know, she is a 56 y.o. female with bilateral urolithiasis. These stones have been present for 4 years.     She reports one symptomatic stone event since their last visit.   These were associated with right flank pain.  Since their last visit, the patient underwent no procedures for stones.      At their last visit, the plan was for imaging.     The patient was started on no new medications at their last visit.   The patient reports no new stone medications were prescribed.   reducing sodium intake and increasing fluid intake was recommended at the patient's last visit.   She has been adhering to recommended diet changes from their last visit.     Today patient is with pain on the right flank     The patient's past medical, surgical, medication, allergy, family, and social histories were reviewed and noncontributory to this illness/condition except as noted below:     The patient's past medical history is notable for:  Medical History:   Diagnosis Date   ? Anxiety    ? Arthritis    ? Asthma    ? Autoimmune disease (HCC)    ? Chronic bronchitis (HCC)    ? Chronic pain after traumatic injury    ? 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 COPD (chronic obstructive pulmonary disease) (HCC)     second hand smoke exposure from parents.   ? Copper deficiency    ? DDD (degenerative disc disease), lumbar    ? Depression    ? Foot injury, left, sequela    ? GERD (gastroesophageal reflux disease)    ? Insomnia    ? Kidney stone    ? Migraine    ? Neuropathy     feet, hands, and legs   ? Pelvic fracture (HCC)    ? Pneumonia    ? Seasonal allergic reaction    ? Status post biliopancreatic diversion with duodenal switch    ? Syncope due to orthostatic hypotension    ? TBI (traumatic brain injury)     with short term memory loss, hard to focus, left little finger is numb.   ? TBI (traumatic brain injury) 2021         The patient's past surgical history is notable for:  Surgical History:   Procedure Laterality Date   ? EXCISION EXCESSIVE SKIN/ SUBCUTANEOUS TISSUE - ABDOMEN WITH INFRAUMBILICAL PANNICULECTOMY, UMBILECTOMY, APPLICATION NEGATIVE PRESSURE WOUND THERAPY N/A 11/15/2018    Performed by Ursula Beath, MD at [facility] OR   ? SURGERY Left 2021    Broken wrist repair   ? MAMMAPLASTY REDUCTION Bilateral 06/01/2020    Performed by Marlinda Mike, MD at [facility] OR   ? RECONSTRUCTION NIPPLE/ AREOLA Bilateral 06/01/2020    Performed by Marlinda Mike, MD at [facility] OR   ? CYSTOURETHROSCOPY WITH URETEROSCOPY AND/ OR PYELOSCOPY - WITH LITHOTRIPSY INCLUDING INSERTION URETERAL STENT Left 04/05/2021    Performed by Beckie Busing[facility] MD at [facility] OR   ? BARIATRIC SURGERY     ? CESAREAN SECTION, CLASSIC     ? CHOLECYSTECTOMY     ? ENDOMETRIAL ABLATION      times 3   ? HYSTERECTOMY     ?  KNEE ARTHROPLASTY Bilateral    ? OTHER SURGICAL HISTORY      as per PMH/problem list   ? TONSILLECTOMY          The patient's past family history is notable for:  History reviewed. No pertinent family history.      The patient's past social history is notable for:  Social History     Socioeconomic History   ? Marital status: Single   ? Number of children: 1   Tobacco Use   ? Smoking status: Never   ? Smokeless tobacco: Never   Vaping Use   ? Vaping Use: Never used   Substance and Sexual Activity   ? Alcohol use: Yes     Comment: socially    ? Drug use: Never   Social History Narrative    Lives alone, not working, on disability from chronic pain and TBI         Complete review of systems was performed and reviewed today. It is notable for:   Review of Systems   Constitutional: Negative for fever, chills, weight loss and malaise/fatigue.   HENT: Negative for hearing loss, neck pain and tinnitus.    Eyes: Negative for blurred vision, double vision and photophobia.   Respiratory: Negative for cough, sputum production and wheezing.    Cardiovascular: Negative for chest pain and palpitations.   Gastrointestinal: Negative for heartburn, nausea, vomiting, abdominal pain, diarrhea and constipation.   Genitourinary: Negative for dysuria, urgency and frequency.   Musculoskeletal: Negative for back pain and joint pain.   Skin: Negative for itching and rash.   Neurological: Negative for dizziness, tingling, focal weakness and headaches.   Endo/Heme/Allergies: Does not bruise/bleed easily.   Psychiatric/Behavioral: Negative for depression. The patient is not nervous/anxious.        The patient is currently taking the following medications:  Current Outpatient Medications   Medication Sig Dispense Refill   ? acetaminophen (TYLENOL) 500 mg tablet Take two tablets by mouth every 6 hours. Max of 4,000 mg of acetaminophen in 24 hours.    Please take 2 Tablets (1000 mg) every 6 hrs for the first three days after your procedure. After three days, you may take Tylenol as needed. 40 tablet 0   ? ADVAIR HFA 230-21 mcg/actuation inhaler INHALE 2 PUFFS BY MOUTH EVERY TWELVE HOURS 12 g 2   ? albuterol 0.5% (PROVENTIL) 2.5 mg/0.5 mL nebulizer solution Inhale 2.5 mg solution by nebulizer as directed every 6 hours as needed for Shortness of Breath or Wheezing.     ? albuterol sulfate (PROAIR HFA) 90 mcg/actuation HFA aerosol inhaler Inhale 2 puffs by mouth into the lungs every 6 hours as needed for Wheezing or Shortness of Breath. Shake well before use.      ? aspirin EC 81 mg tablet Take 81 mg by mouth daily.  3   ? baclofen (LIORESAL) 10 mg tablet Take one tablet by mouth every 6 hours. 120 tablet 0   ? benztropine (COGENTIN) 1 mg tablet      ? biotin 1,000 mcg chew Chew 1 tablet by mouth daily.     ? busPIRone (BUSPAR) 5 mg tablet      ? calcium carbonate (CALCIUM 600 PO) Take 1 tablet by mouth daily.     ? cetirizine (ZYRTEC) 10 mg tablet Take 10 mg by mouth daily.  1   ? cholecalciferol(+) (Vitamin D3) 50,000 units capsule Take 50,000 Units by mouth twice weekly.     ? Copper Gluconate  2 mg tab Take 2 mg by mouth daily.     ? cyanocobalamin (VITAMIN B-12, RUBRAMIN) 1,000 mcg/mL injection Inject 1 mL into the muscle every 30 days.     ? duloxetine DR (CYMBALTA) 30 mg capsule Take 30 mg by mouth twice daily.     ? ELDERBERRY FRUIT PO Take 1 capsule by mouth twice daily. 2000mg      ? ergocalciferol (vitamin D2) (DRISDOL) 1,250 mcg (50,000 unit) capsule      ? ferrous sulfate (FEOSOL) 325 mg (65 mg iron) tablet Take 325 mg by mouth daily. Take on an empty stomach at least 1 hour before or 2 hours after food.     ? fluconazole (DIFLUCAN) 150 mg tablet Take 150 mg by mouth daily as needed (for topical yeast infection).     ? fludrocortisone (FLORINEF) 0.1 mg tablet Take 0.1 mg by mouth daily.     ? fluoxetine (PROZAC) 40 mg capsule Take 40 mg by mouth daily.     ? HYDROcodone/acetaminophen (NORCO) 10/325 mg tablet      ? hyoscyamine sulfate (LEVSIN) 0.125 mg tablet Take one tablet by mouth every 4 hours as needed (bladder spasms). 40 tablet 0   ? loperamide (IMODIUM) 2 mg capsule Take 2 mg by mouth as Needed for Diarrhea.     ? LORazepam (ATIVAN) 0.5 mg tablet Take 1 tablet by mouth every 6 hours as needed for Nausea.     ? midodrine (PROAMATINE) 5 mg tablet      ? montelukast (SINGULAIR) 10 mg tablet Take one tablet by mouth at bedtime daily. 30 tablet 11   ? nystatin (MYCOSTATIN) 100,000 unit/g topical cream Apply  topically to affected area twice daily as needed (for topical yeast infection).     ? nystatin (NYSTOP) 100,000 unit/g topical powder Apply 1 g topically to affected area as Needed.     ? omeprazole DR(+) (PRILOSEC) 20 mg capsule Take 20 mg by mouth daily.     ? ondansetron (ZOFRAN ODT) 4 mg rapid dissolve tablet      ? oxyCODONE-acetaminophen(+) (PERCOCET; ENDOCET) 7.5-325 mg tablet Take 1 tablet by mouth three times daily       ? phenazopyridine (PYRIDIUM) 200 mg tablet Take one tablet by mouth three times daily as needed. Take after meals for up to 2 days. 6 tablet 0   ? potassium citrate (UROCIT-K) 10 mEq (1,080 mg) tablet      ? pramipexole (MIRAPEX) 0.125 mg tablet      ? pregabalin (LYRICA) 75 mg capsule      ? promethazine (PHENERGAN) 25 mg tablet Take 25 mg by mouth as Needed.     ? rizatriptan (MAXALT-MLT) 5 mg rapid dissolve tablet Take one tablet by mouth at onset of headache. May repeat in 2 hours if needed. 9 tablet 3   ? sucralfate (CARAFATE) 1 gram tablet      ? Syringe (Disposable) 5 mL syrg Use  as directed.     ? tamsulosin (FLOMAX) 0.4 mg capsule Take one capsule by mouth daily as needed. Do not crush, chew or open capsules. Take 30 minutes following the same meal each day. 10 capsule 0   ? vitamins, multiple cap Take 1 capsule by mouth daily.     ? zolpidem CR(+) (AMBIEN CR) 12.5 mg tablet Take 12.5 mg by mouth at bedtime as needed for Sleep. Indications: difficulty sleeping       No current facility-administered medications for this visit.  The patient is allergic to:   Allergies   Allergen Reactions   ? Adhesive Tape (Rosins) RASH     grid tape; Paper Tape is tolerated        Objective   During the physical examination today, the patients vital signs were  BP 113/81 (BP Source: Arm, Right Lower, Patient Position: Sitting)  - Pulse 80  - Temp 36.5 ?C (97.7 ?F) (Temporal)  - Ht 174 cm (5' 8.5)  - Wt 106.6 kg (235 lb)  - BMI 35.21 kg/m?       General: alert and oriented times three, pleasant, in no acute distress  HEENT: Normocephalic, atraumatic  Neck: normal range of motion  Chest: breathing is unlabored  Abdomen: soft, non-tender, non-distended  Back: no costovertebral tenderness  Extremities: no clubbing, cyanosis, or edema  Psychiatric: mood and affect are normal, the patient is socially appropriate  Neurologic: sensation is grossly intact in the upper and lower extremities         Lab Review and Imaging    The following imaging was personally reviewed and the findings were discussed with the patient by ZO:XWRUE [place]. This demonstrated No Hydroureteronephrosis and Stones right Kidney. Stones measured 0-5 mm.  Fatigue and Pain Assessment 11/02/2021 04/15/2021 02/23/2021 07/07/2020 06/10/2020   Fatigue Scale - - - - -   Pain Score 5 4 7 6 7    Pain Loc - 4 39 2 19   Pain Edu? - - - - -       Stone analysis from the patient's last stone was personally reviewed and results were discussed with the patient. This demonstrated: Not applicable    The following lab results were personally reviewed by me:     Lab Results   Component Value Date    WBC 5.4 06/01/2020    HGB 11.6 (L) 06/01/2020    HCT 36.3 06/01/2020    MCV 90.6 06/01/2020      Lab Results   Component Value Date    CA 9.3 06/01/2020      Lab Results   Component Value Date    NA 140 06/01/2020    K 4.3 06/01/2020    CL 104 06/01/2020    CO2 26 06/01/2020      No results found for: CREATININE              It was a pleasure seeing your patient in the Urology Faculty Practice clinic today.      I addressed urolithiasis with the patient today.      Based on today's visit, we will plan for imaging}.      Additional workup ordered today included preop labs.     Call pt after the ct scan to discuss results      Thank you for allowing [place] to participate in the care of this patient. Please do not hesitate to contact us should you have any questions or concerns.

## 2021-11-03 ENCOUNTER — Encounter: Admit: 2021-11-03 | Discharge: 2021-11-03 | Payer: MEDICARE

## 2021-11-04 ENCOUNTER — Encounter: Admit: 2021-11-04 | Discharge: 2021-11-04 | Payer: MEDICARE

## 2021-11-04 NOTE — Telephone Encounter
Received message from [name] who is inquiring about lab results from this week that she had done with [name].  Returned call, no answer. LVM to let her know that sometimes results can take 2-3 days to make it to our clinic and that we would reach out with results.

## 2021-11-08 ENCOUNTER — Encounter: Admit: 2021-11-08 | Discharge: 2021-11-08 | Payer: MEDICARE

## 2021-11-08 NOTE — Progress Notes
Have still not received faxed lab results from [facility]. Called lab and provided fax number again for results to be sent.

## 2021-11-14 ENCOUNTER — Encounter: Admit: 2021-11-14 | Discharge: 2021-11-14 | Payer: MEDICARE

## 2021-11-14 DIAGNOSIS — N200 Calculus of kidney: Secondary | ICD-10-CM

## 2021-11-14 NOTE — Telephone Encounter
Called pt with results and plan to have renal US in one year and appt with [facility]. Verbalized understanding.

## 2021-11-15 ENCOUNTER — Encounter: Admit: 2021-11-15 | Discharge: 2021-11-15 | Payer: MEDICARE

## 2021-11-16 ENCOUNTER — Encounter: Admit: 2021-11-16 | Discharge: 2021-11-16 | Payer: MEDICARE

## 2021-11-16 ENCOUNTER — Ambulatory Visit: Admit: 2021-11-16 | Discharge: 2021-11-16 | Payer: MEDICAID

## 2021-11-16 DIAGNOSIS — J42 Unspecified chronic bronchitis: Secondary | ICD-10-CM

## 2021-11-16 DIAGNOSIS — K219 Gastro-esophageal reflux disease without esophagitis: Secondary | ICD-10-CM

## 2021-11-16 DIAGNOSIS — I951 Orthostatic hypotension: Secondary | ICD-10-CM

## 2021-11-16 DIAGNOSIS — Z9884 Bariatric surgery status: Secondary | ICD-10-CM

## 2021-11-16 DIAGNOSIS — S99922S Unspecified injury of left foot, sequela: Secondary | ICD-10-CM

## 2021-11-16 DIAGNOSIS — M1990 Unspecified osteoarthritis, unspecified site: Secondary | ICD-10-CM

## 2021-11-16 DIAGNOSIS — M5136 Other intervertebral disc degeneration, lumbar region: Secondary | ICD-10-CM

## 2021-11-16 DIAGNOSIS — E610 Copper deficiency: Secondary | ICD-10-CM

## 2021-11-16 DIAGNOSIS — S329XXA Fracture of unspecified parts of lumbosacral spine and pelvis, initial encounter for closed fracture: Secondary | ICD-10-CM

## 2021-11-16 DIAGNOSIS — N200 Calculus of kidney: Secondary | ICD-10-CM

## 2021-11-16 DIAGNOSIS — R1110 Vomiting, unspecified: Secondary | ICD-10-CM

## 2021-11-16 DIAGNOSIS — G629 Polyneuropathy, unspecified: Secondary | ICD-10-CM

## 2021-11-16 DIAGNOSIS — G43009 Migraine without aura, not intractable, without status migrainosus: Secondary | ICD-10-CM

## 2021-11-16 DIAGNOSIS — S069XAA TBI (traumatic brain injury): Secondary | ICD-10-CM

## 2021-11-16 DIAGNOSIS — J302 Other seasonal allergic rhinitis: Secondary | ICD-10-CM

## 2021-11-16 DIAGNOSIS — M359 Systemic involvement of connective tissue, unspecified: Secondary | ICD-10-CM

## 2021-11-16 DIAGNOSIS — J45909 Unspecified asthma, uncomplicated: Secondary | ICD-10-CM

## 2021-11-16 DIAGNOSIS — G8921 Chronic pain due to trauma: Secondary | ICD-10-CM

## 2021-11-16 DIAGNOSIS — F419 Anxiety disorder, unspecified: Secondary | ICD-10-CM

## 2021-11-16 DIAGNOSIS — J189 Pneumonia, unspecified organism: Secondary | ICD-10-CM

## 2021-11-16 DIAGNOSIS — G43909 Migraine, unspecified, not intractable, without status migrainosus: Secondary | ICD-10-CM

## 2021-11-16 DIAGNOSIS — J449 Chronic obstructive pulmonary disease, unspecified: Secondary | ICD-10-CM

## 2021-11-16 DIAGNOSIS — F32A Depression: Secondary | ICD-10-CM

## 2021-11-16 DIAGNOSIS — G4700 Insomnia, unspecified: Secondary | ICD-10-CM

## 2021-11-16 MED ORDER — UBRELVY 50 MG PO TAB
50 mg | ORAL_TABLET | Freq: Every day | ORAL | 3 refills | 30.00000 days | Status: AC | PRN
Start: 2021-11-16 — End: ?

## 2021-11-16 NOTE — Progress Notes
Obtained patient's verbal consent to treat them and their agreement to [facility] financial policy and NPP via this telehealth visit during the [facility] Emergency    The following visit was completed via Zoom (Audio/Video).    Gabriela Hunt is a 56 y.o. female.    CC: Follow-up for headaches       History of Present Illness    She presents for follow up in regard to migraines. Headaches occur most often occur in the afternoon. Happen 2-3x/month. Has sensitivity to lights and sounds with headaches.    Was seeing speech therapy at the hospital.  Uncertain whether its been helpful for speech. Planning to begin working with ST in the home with [employer]     Current medications:  maxalt - not much benefit  lyrica  ?  Prior medications:  25mg  topiramate twice daily  gabapentin  100mg  sumatriptan - shortens migraine       Medical History:   Diagnosis Date   ? Anxiety    ? Arthritis    ? Asthma    ? Autoimmune disease (HCC)    ? Chronic bronchitis (HCC)    ? Chronic pain after traumatic injury    ? 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 COPD (chronic obstructive pulmonary disease) (HCC)     second hand smoke exposure from parents.   ? Copper deficiency    ? DDD (degenerative disc disease), lumbar    ? Depression    ? Foot injury, left, sequela    ? GERD (gastroesophageal reflux disease)    ? Insomnia    ? Kidney stone    ? Migraine    ? Neuropathy     feet, hands, and legs   ? Pelvic fracture (HCC)    ? Pneumonia    ? Seasonal allergic reaction    ? Status post biliopancreatic diversion with duodenal switch    ? Syncope due to orthostatic hypotension    ? TBI (traumatic brain injury)     with short term memory loss, hard to focus, left little finger is numb.   ? TBI (traumatic brain injury) 2021     Surgical History:   Procedure Laterality Date   ? EXCISION EXCESSIVE SKIN/ SUBCUTANEOUS TISSUE - ABDOMEN WITH INFRAUMBILICAL PANNICULECTOMY, UMBILECTOMY, APPLICATION NEGATIVE PRESSURE WOUND THERAPY N/A 11/15/2018    Performed by Ursula Beath, MD at [facility] OR   ? SURGERY Left 2021    Broken wrist repair   ? MAMMAPLASTY REDUCTION Bilateral 06/01/2020    Performed by Marlinda Mike, MD at [facility] OR   ? RECONSTRUCTION NIPPLE/ AREOLA Bilateral 06/01/2020    Performed by Marlinda Mike, MD at [facility] OR   ? CYSTOURETHROSCOPY WITH URETEROSCOPY AND/ OR PYELOSCOPY - WITH LITHOTRIPSY INCLUDING INSERTION URETERAL STENT Left 04/05/2021    Performed by Beckie Busing, MD at [facility] OR   ? BARIATRIC SURGERY     ? CESAREAN SECTION, CLASSIC     ? CHOLECYSTECTOMY     ? ENDOMETRIAL ABLATION      times 3   ? HYSTERECTOMY     ? KNEE ARTHROPLASTY Bilateral    ? OTHER SURGICAL HISTORY      as per PMH/problem list   ? TONSILLECTOMY       Social History     Socioeconomic History   ? Marital status: Single   ? Number of children: 1   Tobacco Use   ? Smoking status:  Never   ? Smokeless tobacco: Never   Vaping Use   ? Vaping Use: Never used   Substance and Sexual Activity   ? Alcohol use: Yes     Comment: socially    ? Drug use: Never   Social History Narrative    Lives alone, not working, on disability from chronic pain and TBI     No family history on file.    Review of Systems      Objective:         ? ADVAIR HFA 230-21 mcg/actuation inhaler INHALE 2 PUFFS BY MOUTH EVERY TWELVE HOURS   ? albuterol 0.5% (PROVENTIL) 2.5 mg/0.5 mL nebulizer solution Inhale 2.5 mg solution by nebulizer as directed every 6 hours as needed for Shortness of Breath or Wheezing.   ? albuterol sulfate (PROAIR HFA) 90 mcg/actuation HFA aerosol inhaler Inhale 2 puffs by mouth into the lungs every 6 hours as needed for Wheezing or Shortness of Breath. Shake well before use.    ? aspirin EC 81 mg tablet Take 81 mg by mouth daily.   ? baclofen (LIORESAL) 10 mg tablet Take one tablet by mouth every 6 hours. (Patient taking differently: Take 10 mg by mouth three times daily.)   ? benztropine (COGENTIN) 1 mg tablet Take 1 mg by mouth twice daily.   ? biotin 1,000 mcg chew Chew 1 tablet by mouth daily.   ? calcium carbonate (CALCIUM 600 PO) Take 1 tablet by mouth daily.   ? cetirizine (ZYRTEC) 10 mg tablet Take 10 mg by mouth daily.   ? cholecalciferol(+) (Vitamin D3) 50,000 units capsule Take 100,000 Units by mouth every 7 days.   ? Copper Gluconate 2 mg tab Take 2 mg by mouth daily.   ? cyanocobalamin (VITAMIN B-12, RUBRAMIN) 1,000 mcg/mL injection Inject 1 mL into the muscle every 30 days.   ? duloxetine DR (CYMBALTA) 30 mg capsule Take 30 mg by mouth daily.   ? fluconazole (DIFLUCAN) 150 mg tablet Take 150 mg by mouth daily as needed (for topical yeast infection).   ? fludrocortisone (FLORINEF) 0.1 mg tablet Take 0.1 mg by mouth daily.   ? fluoxetine (PROZAC) 40 mg capsule Take 40 mg by mouth daily.   ? HYDROcodone/acetaminophen (NORCO) 7.5/325 mg tablet Take 1 tablet by mouth three times daily.   ? loperamide (IMODIUM) 2 mg capsule Take 2 mg by mouth as Needed for Diarrhea.   ? LORazepam (ATIVAN) 0.5 mg tablet Take 1 tablet by mouth every 6 hours as needed for Nausea.   ? midodrine (PROAMATINE) 5 mg tablet Take 5 mg by mouth daily.   ? omeprazole DR(+) (PRILOSEC) 20 mg capsule Take 40 mg by mouth daily.   ? ondansetron (ZOFRAN ODT) 4 mg rapid dissolve tablet    ? potassium citrate (UROCIT-K) 10 mEq (1,080 mg) tablet    ? pramipexole (MIRAPEX) 0.125 mg tablet Take 0.125 mg by mouth at bedtime daily.   ? pregabalin (LYRICA) 100 mg capsule Take 100 mg by mouth twice daily.   ? rizatriptan (MAXALT-MLT) 5 mg rapid dissolve tablet Take one tablet by mouth at onset of headache. May repeat in 2 hours if needed.   ? sucralfate (CARAFATE) 1 gram tablet    ? Syringe (Disposable) 5 mL syrg Use  as directed.   ? vitamins, multiple cap Take 1 capsule by mouth daily.   ? zolpidem CR(+) (AMBIEN CR) 12.5 mg tablet Take 12.5 mg by mouth at bedtime as needed for Sleep. Indications: difficulty  sleeping     There were no vitals filed for this visit.  There is no height or weight on file to calculate BMI. Physical Exam    Alert and in no distress.  Converses and answers questions appropriately.  Speech is normal without dysarthria.  Face is symmetric with normal movements.         Assessment and Plan:    Impression:  1. Episodic migraine without aura.  Current frequency is 2-3 times per month.  Lack of benefit to both Imitrex as well as Maxalt.  2. History of concussion.  She reports concussions in both 2011 and 2021 which occurred with ground-level falls resulting in transient loss of awareness.  3. Word finding difficulties/?  Aphasia.  She notes persistent word finding difficulty since most recent head injury in February 2021.  She is working with speech therapy.  Reviewed MRI of the head from August 2022.  The images are motion limited to a degree though I do not appreciate any significant abnormalities with the brain parenchyma.    Plan:  1. Discontinue Maxalt due to insufficient benefit.  2. Instead will start 50 mg Ubrelvy daily as needed.  3. Continue speech therapy, currently scheduled to start in-home therapies with [facility].  4. Return for follow-up yearly, earlier as needed.    Total Time Today was 20 minutes in the following activities: Preparing to see the patient, Obtaining and/or reviewing separately obtained history, Performing a medically appropriate examination and/or evaluation, Counseling and educating the patient/family/caregiver, Ordering medications, tests, or procedures and Documenting clinical information in the electronic or other health record

## 2021-11-17 ENCOUNTER — Encounter: Admit: 2021-11-17 | Discharge: 2021-11-17 | Payer: MEDICARE

## 2021-11-18 ENCOUNTER — Encounter: Admit: 2021-11-18 | Discharge: 2021-11-18 | Payer: MEDICARE

## 2021-11-18 DIAGNOSIS — N200 Calculus of kidney: Secondary | ICD-10-CM

## 2021-11-22 ENCOUNTER — Encounter: Admit: 2021-11-22 | Discharge: 2021-11-22 | Payer: MEDICARE

## 2021-11-22 NOTE — Telephone Encounter
PA with clinicals for Ubrelvy submitted on [email or web address].    Information sent to [employer].

## 2021-11-24 ENCOUNTER — Encounter: Admit: 2021-11-24 | Discharge: 2021-11-24 | Payer: MEDICARE

## 2021-11-24 DIAGNOSIS — R4789 Other speech disturbances: Secondary | ICD-10-CM

## 2021-11-24 DIAGNOSIS — Z87820 Personal history of traumatic brain injury: Secondary | ICD-10-CM

## 2021-11-24 DIAGNOSIS — G43009 Migraine without aura, not intractable, without status migrainosus: Secondary | ICD-10-CM

## 2022-02-03 ENCOUNTER — Encounter: Admit: 2022-02-03 | Discharge: 2022-02-03 | Payer: MEDICARE

## 2022-03-30 ENCOUNTER — Encounter: Admit: 2022-03-30 | Discharge: 2022-03-30 | Payer: MEDICARE

## 2022-05-29 ENCOUNTER — Encounter: Admit: 2022-05-29 | Discharge: 2022-05-29 | Payer: MEDICARE

## 2022-06-28 ENCOUNTER — Encounter: Admit: 2022-06-28 | Discharge: 2022-06-28 | Payer: MEDICARE

## 2022-06-29 ENCOUNTER — Encounter: Admit: 2022-06-29 | Discharge: 2022-06-29 | Payer: MEDICARE

## 2022-11-10 ENCOUNTER — Encounter: Admit: 2022-11-10 | Discharge: 2022-11-10 | Payer: MEDICARE

## 2022-11-10 MED ORDER — COLESTIPOL 1 GRAM PO TAB
1 g | ORAL_TABLET | Freq: Two times a day (BID) | ORAL | 1 refills
Start: 2022-11-10 — End: ?

## 2022-11-15 ENCOUNTER — Encounter: Admit: 2022-11-15 | Discharge: 2022-11-15 | Payer: MEDICARE

## 2022-11-15 MED ORDER — COLESTIPOL 1 GRAM PO TAB
1 g | ORAL_TABLET | Freq: Two times a day (BID) | ORAL | 1 refills
Start: 2022-11-15 — End: ?

## 2022-11-24 ENCOUNTER — Encounter: Admit: 2022-11-24 | Discharge: 2022-11-24 | Payer: MEDICARE

## 2022-11-24 MED ORDER — COLESTIPOL 1 GRAM PO TAB
1 g | ORAL_TABLET | Freq: Two times a day (BID) | ORAL | 1 refills
Start: 2022-11-24 — End: ?

## 2022-12-11 ENCOUNTER — Encounter: Admit: 2022-12-11 | Discharge: 2022-12-11 | Payer: MEDICARE

## 2022-12-11 MED ORDER — COLESTIPOL 1 GRAM PO TAB
1 g | ORAL_TABLET | Freq: Two times a day (BID) | ORAL | 1 refills
Start: 2022-12-11 — End: ?

## 2023-02-08 ENCOUNTER — Encounter: Admit: 2023-02-08 | Discharge: 2023-02-08 | Payer: MEDICARE

## 2023-03-08 ENCOUNTER — Encounter: Admit: 2023-03-08 | Discharge: 2023-03-08 | Payer: MEDICARE

## 2023-03-08 MED ORDER — COLESTIPOL 1 GRAM PO TAB
1 g | ORAL_TABLET | Freq: Two times a day (BID) | ORAL | 1 refills
Start: 2023-03-08 — End: ?

## 2023-06-05 ENCOUNTER — Encounter: Admit: 2023-06-05 | Discharge: 2023-06-05 | Payer: MEDICARE

## 2023-06-05 MED ORDER — COLESTIPOL 1 GRAM PO TAB
1 g | ORAL_TABLET | Freq: Two times a day (BID) | ORAL | 1 refills
Start: 2023-06-05 — End: ?

## 2023-07-03 ENCOUNTER — Encounter: Admit: 2023-07-03 | Discharge: 2023-07-03 | Payer: MEDICARE

## 2023-07-03 MED ORDER — COLESTIPOL 1 GRAM PO TAB
1 g | ORAL_TABLET | Freq: Two times a day (BID) | ORAL | 1 refills
Start: 2023-07-03 — End: ?

## 2024-02-06 ENCOUNTER — Encounter: Admit: 2024-02-06 | Discharge: 2024-02-06 | Payer: MEDICARE

## 2024-02-12 ENCOUNTER — Encounter: Admit: 2024-02-12 | Discharge: 2024-02-12 | Payer: MEDICARE

## 2024-02-12 NOTE — Progress Notes
 Medical Records Request    To: Susan Ensign, MD (PCP)    Requesting the following records for continuation of care:    Recent Labs  Office Notes     EKGs   Any Recent Cardiac Testing    Please Fax to:  Harle Libra., MSN, RN  [facility]   Cardiovascular Medicine - [facility]  Phone: [phone]  Fax: [phone]    ** NO ACTION REQUIRED FROM PATIENT **  This is a note sent to another healthcare provider to request outside records.

## 2024-02-22 ENCOUNTER — Encounter: Admit: 2024-02-22 | Discharge: 2024-02-22 | Payer: MEDICARE

## 2024-02-29 ENCOUNTER — Encounter: Admit: 2024-02-29 | Discharge: 2024-02-29 | Payer: MEDICARE

## 2024-02-29 DIAGNOSIS — R55 Syncope and collapse: Secondary | ICD-10-CM

## 2024-02-29 DIAGNOSIS — R079 Chest pain, unspecified: Secondary | ICD-10-CM

## 2024-02-29 NOTE — Progress Notes
 CARDIAC NEW PATIENT PROFILE      PHYSICIANS INFORMATION:   PCP: Dr. Susan Ensign, MD    REASON FOR VISIT/DIAGNOSIS: Self- referral for palpitations.  5/16 - spoke to patient.  She wants to reestablish cardiac care with Dr. Quinten Bucker as she has been feeling palpitations and has had episode of fast heartbeats.  She also states that she has some sharp chest pains that occur randomly and last for a few seconds.  She denies any shortness of breath.  Her biological sister recently died of heart failure following a heart attack so she wants to be sure she is getting checked out.    PERTINENT CARDIAC HISTORY:    Orthostatic hypotension, bradycardia    OTHER MEDICAL HISTORY:    Asthma, COPD, history of TBI    MOST RECENT PERTINENT TESTING/PROCEDURES:     01/29/23 - Holter Monitor at [facility] - The patient was monitored for 48 hours utilizing two separate 24 hour holter monitors.  The rhythm is sinus with a minimum heart rate of 59 bpm, maximum heart rate is 130 bpm and a mean of 72.  There is rare ectopy identified.  There were no arrhythmias seen.    08/08/18 - Regadenoson MPI at [facility] - This study is normal with no evidence of significant myocardial ischemia. Left ventricular systolic function is normal. There are no high risk prognostic indicators present. The pharmacologic ECG portion of the study is negative for ischemia.   08/08/18 - Echocardiogram at [facility] - Normal left ventricular systolic function with EF = 60%. Normal chamber sizes. Normal cardiac valve structure and function. Normal pulmonary artery pressure with estimated systolic PAP = . The RV apex appeared to be hypokinetic. Further cardiac imaging could be considered to further elucidate this finding regarding the RV apex.    PERTINENT SURGERIES:    No cardiac surgeries.  Has had gastric bypass surgery and a total knee replacement.    SOCIAL HISTORY:   No smoking, no alcohol use, no recreational drug use.  Pt denies any caffeine use.     PERTINENT CARDIAC FAMILY HISTORY:    Mother: Hypertension, CAD   Father:  stroke   Sister: MI and HF   Brother: Aortic Dissection    MEDICATIONS/ALLERGIES: reviewed and reconciled.    LAB: LDL 99, not on statin. LIPID PROFILE (01/15/2024)     RECORDS: internal/external and care everywhere    Patient profile/chart bookmarks by Gaylan Fauver C, RN

## 2024-03-06 ENCOUNTER — Encounter: Admit: 2024-03-06 | Discharge: 2024-03-06 | Payer: MEDICARE

## 2024-03-06 ENCOUNTER — Ambulatory Visit: Admit: 2024-03-06 | Discharge: 2024-03-06 | Payer: MEDICARE

## 2024-03-06 DIAGNOSIS — R001 Bradycardia, unspecified: Secondary | ICD-10-CM

## 2024-03-06 DIAGNOSIS — Z136 Encounter for screening for cardiovascular disorders: Secondary | ICD-10-CM

## 2024-03-06 DIAGNOSIS — Z9884 Bariatric surgery status: Secondary | ICD-10-CM

## 2024-03-06 DIAGNOSIS — R079 Chest pain, unspecified: Secondary | ICD-10-CM

## 2024-03-06 DIAGNOSIS — I951 Orthostatic hypotension: Secondary | ICD-10-CM

## 2024-03-06 DIAGNOSIS — S069X9A Unspecified intracranial injury with loss of consciousness of unspecified duration, initial encounter: Secondary | ICD-10-CM

## 2024-03-06 DIAGNOSIS — R002 Palpitations: Secondary | ICD-10-CM

## 2024-03-06 DIAGNOSIS — J449 Chronic obstructive pulmonary disease, unspecified: Secondary | ICD-10-CM

## 2024-03-06 DIAGNOSIS — E271 Primary adrenocortical insufficiency: Secondary | ICD-10-CM

## 2024-03-06 DIAGNOSIS — Z9889 Other specified postprocedural states: Secondary | ICD-10-CM

## 2024-03-06 NOTE — Patient Instructions
 Thank you for visiting our office today.    Continue the same medications as you have been doing.          We will be pursuing the following tests after your appointment today:     Stress test   Echocardiogram   14 day heart monitor- this will be mailed to your home address with all instructions    Please call us  in the meantime with any questions or concerns.        Please allow 5-7 business days for our providers to review your results. All normal results will go to MyChart. If you do not have Mychart, it is strongly recommended to get this so you can easily view all your results. If you do not have mychart, we will attempt to call you once with normal lab and testing results. If we cannot reach you by phone with normal results, we will send you a letter.  If you have not heard the results of your testing after one week please give us  a call.       Your Cardiovascular Medicine [name] Team Siegfried Dress, Towanda Fret and [place])  phone number is [phone].

## 2024-03-06 NOTE — Progress Notes
 Orders for echo and stress test faxed to [facility] scheduling at [phone]. Patient requests to have testing completed at outside facility.

## 2024-03-06 NOTE — Progress Notes
 To our valued patient,     We have enrolled your heart monitor and requested it be sent to your home.  You should receive this within 2-3 business days. Please wear the monitor for 14 days. When you have completed the study, please remove the device, and mail it back to the company. Please call [employer] Customer Service at [phone] with questions about placement, troubleshooting, and insurance coverage. You can reach the [facility] ambulatory heart monitor team at [phone].      Your Heart Rhythm Management Team  Cardiovascular Medicine Department at [facility]              Ambulatory (External) Cardiac Monitor Enrollment Record     Placement Location: Home Enrollment  Clinic Location: MPB5  Vendor: [employer] ([employer])  Mobile Cardiac Telemetry (MCOT/MCT)?: No  Duration of Monitor (in days): 14  Monitor Diagnosis: Bradycardia (R00.1)  Secondary Monitor Diagnosis: Palpitations (R00.2)  Ordering Provider: Quinten Bucker, Marina   AMB Monitor Serial Number: home  No data recorded    Start Time and Date: 03/06/24 11:21 AM   Patient Name: Gabriela Hunt  DOB: [birth year]  MRN: [id number]  Sex: female  Mobile Phone Number: [phone] (mobile)  Home Phone Number: [phone]  Patient Address: [address] [name] [address]  Insurance Coverage: [employer] COMM[employer] [place] MEDICARE  Insurance ID: [id number]  Insurance Group #: [id number]  Insurance Subscriber: [name],[name]  Implanted Cardiac Device Information: No results found for: [email or web address]      Patient instructed to contact company phone number on the monitor box with questions regarding billing, placement, troubleshooting.     [name]    ____________________________________________________________    Clinic Staff:    Complete additional steps for documentation double check/Co-Sign.  In Follow-up, send chart upon closing encounter to [facility] HR[facility] AMBULATORY MONITORS    HRM Ambulatory Monitoring Team:  Schedule on appropriate template and check-in.   Clinic Placement Schedule on clinic location [facility] schedule   Home Enrollment Schedule on Home Enrollment schedule ([facility])   Given to patient in clinic for self-placement Schedule on Home Enrollment schedule ([facility])   Inpatient Schedule on [facility] AMBULATORY MONITORING template   2. Please enroll with appropriate vendor.

## 2024-03-06 NOTE — Progress Notes
 Date of Service: 03/06/2024    Gabriela Hunt is a 58 y.o. female.       HPI      Gabriela Hunt is a 58 y.o. female with a history of morbid obesity, status post weight reduction surgery, current BMI = 39.98 kg/m?, history of falls and traumatic brain injury, patient is on disability and multiple psychotropic medications.    She is in our office today accompanied by her caregiver.  Patient reports having left-sided chest pain, these come and go, they occur with and without physical activity.    She also reports having shortness of breath and heart palpitations.    In October 2019 she was evaluated with an echocardiogram, the RV apex was thought to be thick.  Subsequently patient was evaluated with the cardiac MRI, it demonstrated normal biventricular function, the RV was mildly dilated with an increased end-diastolic volume (probably secondary to patient's morbid obesity).         Vitals:    03/06/24 0846   BP: 111/84   BP Source: Arm, Left Lower   Pulse: 74   SpO2: 99%   O2 Device: None (Room air)   PainSc: Zero   Weight: 121 kg (266 lb 12.8 oz)   Height: 174 cm (5' 8.5)     Body mass index is 39.98 kg/m?Aaron Aas     Past Medical History  Patient Active Problem List    Diagnosis Date Noted    Chest pain 02/29/2024     08/08/18 - Regadenoson MPI at [facility] - This study is normal with no evidence of significant myocardial ischemia. Left ventricular systolic function is normal. There are no high risk prognostic indicators present. The pharmacologic ECG portion of the study is negative for ischemia.   08/08/18 - Echocardiogram at [facility] - Normal left ventricular systolic function with EF = 60%. Normal chamber sizes. Normal cardiac valve structure and function. Normal pulmonary artery pressure with estimated systolic PAP = . The RV apex appeared to be hypokinetic. Further cardiac imaging could be considered to further elucidate this finding regarding the RV apex.      Peptic ulcer disease 02/22/2024     Per 02/14/24 PCP Office Visit Note      Insomnia 02/22/2024     Per 02/14/24 Psychiatry Office Visit Note      GERD (gastroesophageal reflux disease) 02/22/2024     Per 02/14/24 PCP Office Visit Note      Neuropathy 02/22/2024     Per 02/14/24 Psychiatry Office Visit Note      Obesity 02/22/2024     Per 02/14/24 PCP Office Visit Note      Nephrolithiasis     Macromastia 09/18/2019    S/P panniculectomy 11/15/2018    Pannus, abdominal 10/17/2018    Abnormal echocardiogram 10/01/2018    Addison's disease (CMS-HCC) 07/11/2018    Leg edema 07/11/2018    History of weight loss surgery 07/11/2018    Precordial pain 07/11/2018    Preoperative cardiovascular examination 01/03/2018    Syncope due to orthostatic hypotension     Orthostatic hypotension 07/08/2015    Syncope 07/08/2015     01/29/23 - Holter Monitor at [place] - The patient was monitored for 48 hours utilizing two separate 24 hour holter monitors.  The rhythm is sinus with a minimum heart rate of 59 bpm, maximum heart rate is 130 bpm and a mean of 72.  There is rare ectopy identified.  There were no arrhythmias seen.   02/23/2015 Echo Normal  LV size and systolic function EF 60-65 %.  Diastolic relaxation abnormality.  Mild LAE.  Mild aortic regurgitation. Mild mitral regurgitation.  Mild tricuspid regurgitation.      Sinus bradycardia 07/08/2015    Anxiety 07/08/2015    Eczema 07/08/2015    Depressive disorder 07/08/2015    Asthma 07/08/2015    SOB (shortness of breath) 07/08/2015    Traumatic brain injury (CMS-HCC) 07/08/2015     2011: Marvell Slider getting out of shower and hit her head-short term memory.      COPD (chronic obstructive pulmonary disease) (CMS-HCC) 07/08/2015    Falls 07/08/2015     03/26/15             Review of Systems   Constitutional: Negative.   HENT: Negative.     Eyes: Negative.    Cardiovascular:  Positive for chest pain.   Respiratory: Negative.     Endocrine: Negative.    Hematologic/Lymphatic: Negative.    Skin: Negative.    Musculoskeletal: Negative.    Gastrointestinal: Negative.    Genitourinary: Negative.    Neurological: Negative.    Psychiatric/Behavioral: Negative.     Allergic/Immunologic: Negative.        Physical Exam  General Appearance: Obese, increased BMI = 39.98 kg/m?  Skin: warm, moist, no ulcers or xanthomas  Eyes: conjunctivae and lids normal, pupils are equal and round  Lips & Oral Mucosa: no pallor or cyanosis  Neck Veins: neck veins are flat, neck veins are not distended  Chest Inspection: chest is normal in appearance  Respiratory Effort: breathing comfortably, no respiratory distress  Auscultation/Percussion: lungs clear to auscultation, no rales or rhonchi, no wheezing  Cardiac Rhythm: regular rhythm and normal rate  Cardiac Auscultation: S1, S2 normal, no rub, no gallop  Murmurs: no murmur  Carotid Arteries: normal carotid upstroke bilaterally, no bruit  Lower Extremity Edema: no lower extremity edema  Abdominal Exam: soft, non-tender, no masses, bowel sounds normal  Liver & Spleen: no organomegaly  Language and Memory: patient responsive and seems to comprehend information  Neurologic Exam: neurological assessment grossly intact      Cardiovascular Studies  Twelve-lead EKG demonstrates normal sinus rhythm, incomplete RBBB, ventricular rate 59 bpm, no axis deviation.    Cardiovascular Health Factors  Vitals BP Readings from Last 3 Encounters:   03/06/24 111/84   11/02/21 113/81   04/15/21 (!) 88/65     Wt Readings from Last 3 Encounters:   03/06/24 121 kg (266 lb 12.8 oz)   11/02/21 106.6 kg (235 lb)   04/05/21 102.3 kg (225 lb 9.6 oz)     BMI Readings from Last 3 Encounters:   03/06/24 39.98 kg/m?   11/02/21 35.21 kg/m?   04/05/21 34.30 kg/m?      Smoking Social History     Tobacco Use   Smoking Status Never   Smokeless Tobacco Never      Lipid Profile Cholesterol   Date Value Ref Range Status   01/15/2024 161  Final     HDL   Date Value Ref Range Status   01/15/2024 39 (L) <40 Final     LDL   Date Value Ref Range Status   01/15/2024 99  Final Triglycerides   Date Value Ref Range Status   01/15/2024 118  Final      Blood Sugar Hemoglobin A1C   Date Value Ref Range Status   01/15/2024 5.3  Final     Glucose   Date Value Ref Range Status   02/25/2024  81  Final   01/15/2024 100  Final   06/01/2020 85 70 - 100 MG/DL Final          Problems Addressed Today  Encounter Diagnoses   Name Primary?    Sinus bradycardia Yes    Orthostatic hypotension     Traumatic brain injury with loss of consciousness, initial encounter (CMS-HCC)     Syncope due to orthostatic hypotension     S/P panniculectomy     History of weight loss surgery     Chronic obstructive pulmonary disease, unspecified COPD type (CMS-HCC)     Addison's disease (CMS-HCC)     Screening for heart disease        Assessment and Plan     Assessment:    1.  Chest pain-probably mostly atypical  2.  Symptomatic heart palpitations-this occurred throughout the day  3.  Shortness of breath-multifactorial  4.  History of morbid obesity-patient states that she used to weigh 500 pounds  5.  Status post weight reduction surgery  6.  History of falls with subsequent traumatic brain injury and expressive aphasia-patient is on multiple psychotropic medications  7.  Abnormal findings on an echocardiogram performed in October 2019  The RV apex was thought to be hypokinetic  Patient was evaluated with a cardiac MRI, it demonstrated mildly dilated RV with mildly increased end-diastolic volume-there could be a consequence of morbid obesity  8.  Hypovitaminosis D    Plan:    1.  Patient will be evaluated with the following test:   2D echo Doppler study  14 days Holter monitor  Regadenoson MPI  2.  Will follow-up on the results of the test and will call with further recommendations  3.  Continue vitamin D supplementation, increase protein intake, decrease sugar intake and increase level of physical activity.      Total Time Today was 45 minutes in the following activities: Preparing to see the patient, Obtaining and/or reviewing separately obtained history, Performing a medically appropriate examination and/or evaluation, Counseling and educating the patient/family/caregiver, Ordering medications, tests, or procedures, Referring and communication with other health care professionals (when not separately reported), Documenting clinical information in the electronic or other health record, and Independently interpreting results (not separately reported) and communicating results to the patient/family/caregiver            Current Medications (including today's revisions)   acetaminophen (TYLENOL EXTRA STRENGTH) 500 mg tablet Take two tablets by mouth three times daily as needed for Pain. Max of 4,000 mg of acetaminophen in 24 hours.    albuterol sulfate (PROAIR HFA) 90 mcg/actuation HFA aerosol inhaler Inhale 2 puffs by mouth into the lungs every 6 hours as needed for Wheezing or Shortness of Breath. Shake well before use.     ascorbic acid (vitamin C) 500 mg tablet Take one tablet by mouth daily. (Patient not taking: Reported on 03/06/2024)    aspirin EC 81 mg tablet Take 81 mg by mouth daily.    baclofen (LIORESAL) 10 mg tablet Take one tablet by mouth three times daily.    benzonatate (TESSALON PERLES) 100 mg capsule Take one capsule by mouth three times daily as needed. (Patient not taking: Reported on 03/06/2024)    biotin 1,000 mcg chew Chew one tablet by mouth daily. (Patient not taking: Reported on 03/06/2024)    budesonide-formoterol HFA (SYMBICORT) 160-4.5 mcg/actuation aerosol inhaler Inhale two puffs by mouth into the lungs twice daily.    cariprazine (VRAYLAR) 1.5 mg capsule Take one capsule  by mouth daily.    clotrimazole (LOTRIMIN) 1 % topical cream Apply  topically to affected area twice daily.    cyanocobalamin (VITAMIN B-12, RUBRAMIN) 1,000 mcg/mL injection Inject 1 mL into the muscle every 30 days.    desvenlafaxine succinate (PRISTIQ) 25 mg tablet Take one tablet by mouth daily.    duloxetine DR (CYMBALTA) 60 mg capsule Take one capsule by mouth daily.    ERGOcalciferoL (vitamin D2) (VITAMIN D2) 1,250 mcg (50,000 unit) capsule Take two capsules by mouth every 7 days.    ferrous sulfate (FEOSOL) 325 mg (65 mg iron) tablet Take one tablet by mouth daily. Take on an empty stomach at least 1 hour before or 2 hours after food.    folic acid (FOLVITE) 1 mg tablet Take one tablet by mouth daily.    furosemide (LASIX) 20 mg tablet Take one tablet by mouth daily in the morning as needed. (Patient not taking: Reported on 03/06/2024)    gabapentin (GRALISE) 300 mg ER tablet Take one tablet by mouth three times daily.    guaifenesin (MUCINEX MAX) 1,200 mg ER tablet Take one tablet by mouth twice daily as needed.    hydrOXYzine HCL (ATARAX) 50 mg tablet Take one tablet by mouth three times daily as needed for Itching.    ipratropium bromide (ATROVENT) 0.02 % nebulizer solution Inhale 2.5 mL by mouth into the lungs every 6 hours as needed.    lidocaine (LIDODERM) 5 % topical patch Apply one patch topically to affected area daily. Apply patch for 12 hours, then remove for 12 hours before repeating.    loperamide (IMODIUM A-D) 2 mg capsule Take one capsule by mouth four times daily as needed.    meclizine (ANTIVERT) 25 mg tablet Take one tablet by mouth three times daily. (Patient not taking: Reported on 03/06/2024)    melatonin 10 mg capsule Take one capsule by mouth at bedtime daily.    midodrine (PROAMATINE) 5 mg tablet Take one tablet by mouth three times daily.    MULTIVITAMIN PO Take 1 tablet by mouth daily.    nystatin (MYCOSTATIN) 100,000 unit/g topical cream Apply  topically to affected area twice daily.    omeprazole DR(+) (PRILOSEC) 20 mg capsule Take 40 mg by mouth daily.    ondansetron (ZOFRAN ODT) 4 mg rapid dissolve tablet Dissolve one tablet by mouth every 8 hours as needed.    potassium chloride (K-DUR) 10 mEq tablet Take one tablet by mouth twice daily.    pramipexole (MIRAPEX) 0.125 mg tablet Take 0.125 mg by mouth at bedtime daily.    pregabalin (LYRICA) 150 mg capsule Take one capsule by mouth twice daily. (Patient not taking: Reported on 03/06/2024)    QUEtiapine (SEROQUEL) 200 mg tablet Take one tablet by mouth at bedtime daily. (Patient not taking: Reported on 03/06/2024)    ramelteon (ROZEREM) 8 mg tablet Take one tablet by mouth at bedtime daily. (Patient not taking: Reported on 03/06/2024)    SUMAtriptan succinate (IMITREX) 100 mg tablet Take one tablet by mouth as Needed for Migraine symptoms. Take one tablet by mouth at onset of headache. May repeat after 2 hours if needed. Max of 200 mg in 24 hours. (Patient not taking: Reported on 03/06/2024)    triamcinolone acetonide 0.5 % ointment Apply  topically to affected area three times daily. (Patient not taking: Reported on 03/06/2024)    ubrogepant (UBRELVY) 100 mg tablet Take one tablet by mouth daily as needed. May repeat once after 2 hours based on  response.    white petrolatum-mineral oil (REFRESH LACRI-LUBE) 56.8-42.5 % ophthalmic ointment Apply  to both eyes twice daily.    zolpidem CR (AMBIEN CR) 12.5 mg tablet Take one tablet by mouth at bedtime as needed for Sleep.

## 2024-03-19 ENCOUNTER — Ambulatory Visit: Admit: 2024-03-19 | Discharge: 2024-03-19 | Payer: MEDICARE

## 2024-03-24 ENCOUNTER — Encounter: Admit: 2024-03-24 | Discharge: 2024-03-24 | Payer: MEDICARE

## 2024-03-28 ENCOUNTER — Encounter: Admit: 2024-03-28 | Discharge: 2024-03-28 | Payer: MEDICARE

## 2024-03-28 ENCOUNTER — Ambulatory Visit: Admit: 2024-03-28 | Discharge: 2024-03-28 | Payer: MEDICARE

## 2024-03-31 ENCOUNTER — Encounter: Admit: 2024-03-31 | Discharge: 2024-03-31 | Payer: MEDICARE

## 2024-03-31 NOTE — Telephone Encounter
-----   Message from CHRISTELLA Darter, MD sent at 03/30/2024  5:16 PM CDT -----  Please call this patient and let her know the following:    1.  The echocardiogram demonstrated normal heart function, there are no abnormalities.    2.  The stress test did not show any evidence of blockages.    I suggest further follow-up with our office as needed or if any concerning symptoms occur.    Thank you    ----- Message -----  From: Karalee Donnice BROCKS, MD  Sent: 03/28/2024   1:23 PM CDT  To: Marina  LOISE Darter, MD

## 2024-03-31 NOTE — Telephone Encounter
 Results and recommendations called to patient.
